# Patient Record
Sex: Female | Born: 1985 | Race: Black or African American | Hispanic: No | Marital: Single | Smoking: Never smoker
Health system: Southern US, Community
[De-identification: ages and names within clinical notes are randomized; demographics above are authoritative.]

## PROBLEM LIST (undated history)

## (undated) DIAGNOSIS — T7840XA Allergy, unspecified, initial encounter: Secondary | ICD-10-CM

## (undated) DIAGNOSIS — E282 Polycystic ovarian syndrome: Secondary | ICD-10-CM

## (undated) DIAGNOSIS — D689 Coagulation defect, unspecified: Secondary | ICD-10-CM

## (undated) DIAGNOSIS — E039 Hypothyroidism, unspecified: Secondary | ICD-10-CM

## (undated) DIAGNOSIS — F4321 Adjustment disorder with depressed mood: Secondary | ICD-10-CM

## (undated) DIAGNOSIS — D649 Anemia, unspecified: Secondary | ICD-10-CM

## (undated) DIAGNOSIS — F419 Anxiety disorder, unspecified: Secondary | ICD-10-CM

## (undated) HISTORY — DX: Polycystic ovarian syndrome: E28.2

## (undated) HISTORY — DX: Anemia, unspecified: D64.9

## (undated) HISTORY — DX: Allergy, unspecified, initial encounter: T78.40XA

## (undated) HISTORY — DX: Coagulation defect, unspecified: D68.9

---

## 2007-04-08 ENCOUNTER — Emergency Department: Payer: Self-pay | Admitting: Emergency Medicine

## 2008-08-25 ENCOUNTER — Emergency Department: Payer: Self-pay | Admitting: Emergency Medicine

## 2008-11-08 ENCOUNTER — Ambulatory Visit: Payer: Self-pay | Admitting: Family Medicine

## 2009-03-05 ENCOUNTER — Inpatient Hospital Stay: Payer: Self-pay | Admitting: Unknown Physician Specialty

## 2009-03-05 ENCOUNTER — Observation Stay: Payer: Self-pay

## 2013-07-19 ENCOUNTER — Emergency Department: Payer: Self-pay | Admitting: Emergency Medicine

## 2013-07-24 ENCOUNTER — Emergency Department: Payer: Self-pay | Admitting: Emergency Medicine

## 2013-07-28 ENCOUNTER — Emergency Department: Payer: Self-pay | Admitting: Emergency Medicine

## 2013-07-30 ENCOUNTER — Emergency Department: Payer: Self-pay | Admitting: Emergency Medicine

## 2013-07-31 ENCOUNTER — Emergency Department: Payer: Self-pay | Admitting: Emergency Medicine

## 2013-07-31 LAB — URINALYSIS, COMPLETE
Bilirubin,UR: NEGATIVE
Nitrite: NEGATIVE
Ph: 6 (ref 4.5–8.0)
RBC,UR: NONE SEEN /HPF (ref 0–5)
Specific Gravity: 1.01 (ref 1.003–1.030)
Squamous Epithelial: 6
WBC UR: 61 /HPF (ref 0–5)

## 2013-07-31 LAB — GC/CHLAMYDIA PROBE AMP

## 2013-07-31 LAB — PREGNANCY, URINE: Pregnancy Test, Urine: NEGATIVE m[IU]/mL

## 2013-08-09 ENCOUNTER — Emergency Department: Payer: Self-pay | Admitting: Internal Medicine

## 2013-08-30 ENCOUNTER — Emergency Department: Payer: Self-pay | Admitting: Emergency Medicine

## 2014-06-28 ENCOUNTER — Emergency Department: Payer: Self-pay | Admitting: Emergency Medicine
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
## 2017-01-08 DIAGNOSIS — E049 Nontoxic goiter, unspecified: Secondary | ICD-10-CM | POA: Diagnosis not present

## 2017-01-08 DIAGNOSIS — E041 Nontoxic single thyroid nodule: Secondary | ICD-10-CM | POA: Diagnosis not present

## 2017-01-26 DIAGNOSIS — L738 Other specified follicular disorders: Secondary | ICD-10-CM | POA: Diagnosis not present

## 2017-03-09 DIAGNOSIS — L738 Other specified follicular disorders: Secondary | ICD-10-CM | POA: Diagnosis not present

## 2017-03-31 DIAGNOSIS — L0291 Cutaneous abscess, unspecified: Secondary | ICD-10-CM | POA: Diagnosis not present

## 2017-04-27 DIAGNOSIS — L732 Hidradenitis suppurativa: Secondary | ICD-10-CM | POA: Diagnosis not present

## 2017-06-02 DIAGNOSIS — L818 Other specified disorders of pigmentation: Secondary | ICD-10-CM | POA: Diagnosis not present

## 2017-06-02 DIAGNOSIS — L731 Pseudofolliculitis barbae: Secondary | ICD-10-CM | POA: Diagnosis not present

## 2017-06-02 DIAGNOSIS — L732 Hidradenitis suppurativa: Secondary | ICD-10-CM | POA: Diagnosis not present

## 2017-07-14 DIAGNOSIS — L731 Pseudofolliculitis barbae: Secondary | ICD-10-CM | POA: Diagnosis not present

## 2017-07-14 DIAGNOSIS — B360 Pityriasis versicolor: Secondary | ICD-10-CM | POA: Diagnosis not present

## 2017-07-14 DIAGNOSIS — L732 Hidradenitis suppurativa: Secondary | ICD-10-CM | POA: Diagnosis not present

## 2017-08-13 DIAGNOSIS — G479 Sleep disorder, unspecified: Secondary | ICD-10-CM | POA: Diagnosis not present

## 2017-08-13 DIAGNOSIS — L509 Urticaria, unspecified: Secondary | ICD-10-CM | POA: Diagnosis not present

## 2017-08-27 DIAGNOSIS — L731 Pseudofolliculitis barbae: Secondary | ICD-10-CM | POA: Diagnosis not present

## 2017-08-27 DIAGNOSIS — L818 Other specified disorders of pigmentation: Secondary | ICD-10-CM | POA: Diagnosis not present

## 2017-08-27 DIAGNOSIS — L732 Hidradenitis suppurativa: Secondary | ICD-10-CM | POA: Diagnosis not present

## 2017-09-17 ENCOUNTER — Ambulatory Visit
Admission: EM | Admit: 2017-09-17 | Discharge: 2017-09-17 | Discharge status: Against Medical Advice | Payer: 59 | Attending: Family Medicine | Admitting: Family Medicine

## 2017-09-17 DIAGNOSIS — N898 Other specified noninflammatory disorders of vagina: Secondary | ICD-10-CM

## 2017-09-17 HISTORY — DX: Adjustment disorder with depressed mood: F43.21

## 2017-09-17 NOTE — ED Triage Notes (Addendum)
Pt reports several days of discharge which she thought was yeast infection and used Monistat Egg. Yesterday she noticed some red color in it and thought she was getting an early menstrual cycle. Today it is "orange with small white clumps." No itching or burning. Denies any odor. Pt also would like a full STD check including blood and swabs.

## 2017-09-17 NOTE — ED Provider Notes (Signed)
Patient left (eloped) before being seen.      Tommie SamsCook, Salley Boxley G, DO 09/17/17 1322

## 2017-09-17 NOTE — ED Notes (Signed)
Patient Eloped. Not seen.

## 2017-10-15 DIAGNOSIS — L731 Pseudofolliculitis barbae: Secondary | ICD-10-CM | POA: Diagnosis not present

## 2017-10-23 DIAGNOSIS — N760 Acute vaginitis: Secondary | ICD-10-CM | POA: Diagnosis not present

## 2017-12-17 DIAGNOSIS — L700 Acne vulgaris: Secondary | ICD-10-CM | POA: Diagnosis not present

## 2017-12-17 DIAGNOSIS — L731 Pseudofolliculitis barbae: Secondary | ICD-10-CM | POA: Diagnosis not present

## 2017-12-17 DIAGNOSIS — L308 Other specified dermatitis: Secondary | ICD-10-CM | POA: Diagnosis not present

## 2017-12-25 DIAGNOSIS — Z3169 Encounter for other general counseling and advice on procreation: Secondary | ICD-10-CM | POA: Diagnosis not present

## 2018-01-26 ENCOUNTER — Encounter: Payer: Self-pay | Admitting: Emergency Medicine

## 2018-01-26 ENCOUNTER — Other Ambulatory Visit: Payer: Self-pay

## 2018-01-26 ENCOUNTER — Ambulatory Visit
Admission: EM | Admit: 2018-01-26 | Discharge: 2018-01-26 | Disposition: A | Discharge status: Home / Self Care | Payer: 59 | Attending: Family Medicine | Admitting: Family Medicine

## 2018-01-26 DIAGNOSIS — R1110 Vomiting, unspecified: Secondary | ICD-10-CM | POA: Diagnosis not present

## 2018-01-26 DIAGNOSIS — M791 Myalgia, unspecified site: Secondary | ICD-10-CM | POA: Diagnosis not present

## 2018-01-26 DIAGNOSIS — R05 Cough: Secondary | ICD-10-CM | POA: Diagnosis not present

## 2018-01-26 DIAGNOSIS — B349 Viral infection, unspecified: Secondary | ICD-10-CM

## 2018-01-26 LAB — RAPID INFLUENZA A&B ANTIGENS (ARMC ONLY): INFLUENZA B (ARMC): NEGATIVE

## 2018-01-26 LAB — RAPID INFLUENZA A&B ANTIGENS: Influenza A (ARMC): NEGATIVE

## 2018-01-26 MED ORDER — ONDANSETRON 4 MG PO TBDP: 4.0000 mg | ORAL_TABLET | Freq: Three times a day (TID) | ORAL | 0 refills | Status: DC | PRN

## 2018-01-26 MED ORDER — BENZONATATE 100 MG PO CAPS: 100.0000 mg | ORAL_CAPSULE | Freq: Three times a day (TID) | ORAL | 0 refills | Status: DC | PRN

## 2018-01-26 NOTE — ED Provider Notes (Addendum)
[facility]    CSN: [id number] Arrival date & time: 01/26/18  [id number]  History   Chief Complaint Chief Complaint  Patient presents with  . Cough  . Emesis  . Generalized Body Aches   HPI  32 year old female presents with the above complaints.  That she has felt poorly since this morning.  She has had cough.  She has been "throwing up mucus".  She reports associated body aches.  She states that she feels very fatigued and weak.  No reports of fever.  Her son is sick with a similar illness.  No medications or interventions tried.  No known exacerbating relieving factors.  No other associated symptoms.  No other complaints at this time.  Past Medical History:  Diagnosis Date  . Situational depression    Past Surgical History:  Procedure Laterality Date  . CESAREAN SECTION     OB History    No data available     Home Medications    Prior to Admission medications   Medication Sig Start Date End Date Taking? Authorizing Provider  escitalopram (LEXAPRO) 5 MG tablet Take 5 mg by mouth daily.   Yes [provider]  benzonatate (TESSALON) 100 MG capsule Take 1 capsule (100 mg total) by mouth 3 (three) times daily as needed. 01/26/18   Tommie Samsook, Tachina Spoonemore G, DO  ondansetron (ZOFRAN-ODT) 4 MG disintegrating tablet Take 1 tablet (4 mg total) by mouth every 8 (eight) hours as needed for nausea or vomiting. 01/26/18   Tommie Samsook, Hiroshi Krummel G, DO    Family History Family History  Problem Relation Age of Onset  . Lupus Mother   . Epilepsy Mother     Social History Social History   Tobacco Use  . Smoking status: Never Smoker  . Smokeless tobacco: Never Used  Substance Use Topics  . Alcohol use: Yes    Comment: social  . Drug use: No     Allergies   Patient has no known allergies.   Review of Systems Review of Systems  Constitutional: Negative for fever.  Respiratory: Positive for cough.   Gastrointestinal: Positive for vomiting.  Musculoskeletal:       Body aches.     Physical Exam Triage Vital Signs ED Triage Vitals  Enc Vitals Group     BP 01/26/18 0846 101/72     Pulse Rate 01/26/18 0846 88     Resp 01/26/18 0846 18     Temp 01/26/18 0846 98.5 F (36.9 C)     Temp Source 01/26/18 0846 Oral     SpO2 01/26/18 0846 100 %     Weight 01/26/18 0843 165 lb (74.8 kg)     Height 01/26/18 0843 5\' 2"  (1.575 m)     Head Circumference --      Peak Flow --      Pain Score 01/26/18 0843 10     Pain Loc --      Pain Edu? --      Excl. in GC? --    Updated Vital Signs BP 101/72 (BP Location: Left Arm)   Pulse 88   Temp 98.5 F (36.9 C) (Oral)   Resp 18   Ht 5\' 2"  (1.575 m)   Wt 165 lb (74.8 kg)   LMP 01/14/2018 (Approximate)   SpO2 100%   BMI 30.18 kg/m  Physical Exam  Constitutional: She is oriented to person, place, and time. She appears well-developed. No distress.  HENT:  Head: Normocephalic and atraumatic.  Mouth/Throat: Oropharynx is clear  and moist.  Eyes: Conjunctivae are normal. Right eye exhibits no discharge. Left eye exhibits no discharge.  Cardiovascular: Normal rate and regular rhythm.  Pulmonary/Chest: Effort normal and breath sounds normal. She has no wheezes. She has no rales.  Neurological: She is alert and oriented to person, place, and time.  Psychiatric: She has a normal mood and affect. Her behavior is normal.  Nursing note and vitals reviewed.  UC Treatments / Results  Labs (all labs ordered are listed, but only abnormal results are displayed) Labs Reviewed  RAPID INFLUENZA A&B ANTIGENS ([facility] ONLY)    EKG  EKG Interpretation None       Radiology No results found.  Procedures Procedures (including critical care time)  Medications Ordered in UC Medications - No data to display   Initial Impression / Assessment and Plan / UC Course  I have reviewed the triage vital signs and the nursing notes.  Pertinent labs & imaging results that were available during my care of the patient were reviewed by me and  considered in my medical decision making (see chart for details).    32 year old female presents with a viral process. Treating with zofran, tessalon perles.  Final Clinical Impressions(s) / UC Diagnoses   Final diagnoses:  Viral illness   ED Discharge Orders        Ordered    ondansetron (ZOFRAN-ODT) 4 MG disintegrating tablet  Every 8 hours PRN     01/26/18 0954    benzonatate (TESSALON) 100 MG capsule  3 times daily PRN     01/26/18 0954     Controlled Substance Prescriptions [place] Controlled Substance Registry consulted? Not Applicable   Tommie Sams, DO 01/26/18 1007    [address] [place], DO 01/26/18 1008

## 2018-01-26 NOTE — ED Triage Notes (Signed)
Patient c/o cough, vomiting and bodyaches that started this morning.

## 2018-01-26 NOTE — Discharge Instructions (Signed)
Rest. Fluids.  Zofran as needed for nausea.  Tessalon for cough.  Take care  Dr. Adriana Simasook

## 2018-01-28 DIAGNOSIS — N970 Female infertility associated with anovulation: Secondary | ICD-10-CM | POA: Diagnosis not present

## 2018-01-30 DIAGNOSIS — N920 Excessive and frequent menstruation with regular cycle: Secondary | ICD-10-CM | POA: Diagnosis not present

## 2018-02-18 DIAGNOSIS — L731 Pseudofolliculitis barbae: Secondary | ICD-10-CM | POA: Diagnosis not present

## 2018-02-18 DIAGNOSIS — L738 Other specified follicular disorders: Secondary | ICD-10-CM | POA: Diagnosis not present

## 2018-04-09 DIAGNOSIS — N920 Excessive and frequent menstruation with regular cycle: Secondary | ICD-10-CM | POA: Diagnosis not present

## 2018-04-15 DIAGNOSIS — L731 Pseudofolliculitis barbae: Secondary | ICD-10-CM | POA: Diagnosis not present

## 2018-05-19 DIAGNOSIS — Z79899 Other long term (current) drug therapy: Secondary | ICD-10-CM | POA: Diagnosis not present

## 2018-05-19 DIAGNOSIS — R7989 Other specified abnormal findings of blood chemistry: Secondary | ICD-10-CM | POA: Diagnosis not present

## 2018-05-19 DIAGNOSIS — R5383 Other fatigue: Secondary | ICD-10-CM | POA: Diagnosis not present

## 2018-05-19 DIAGNOSIS — Z862 Personal history of diseases of the blood and blood-forming organs and certain disorders involving the immune mechanism: Secondary | ICD-10-CM | POA: Diagnosis not present

## 2018-06-21 DIAGNOSIS — R300 Dysuria: Secondary | ICD-10-CM | POA: Diagnosis not present

## 2018-07-16 DIAGNOSIS — E282 Polycystic ovarian syndrome: Secondary | ICD-10-CM | POA: Diagnosis not present

## 2018-07-16 DIAGNOSIS — Z01419 Encounter for gynecological examination (general) (routine) without abnormal findings: Secondary | ICD-10-CM | POA: Diagnosis not present

## 2018-07-16 DIAGNOSIS — N898 Other specified noninflammatory disorders of vagina: Secondary | ICD-10-CM | POA: Diagnosis not present

## 2018-07-30 DIAGNOSIS — L680 Hirsutism: Secondary | ICD-10-CM | POA: Diagnosis not present

## 2018-07-30 DIAGNOSIS — L731 Pseudofolliculitis barbae: Secondary | ICD-10-CM | POA: Diagnosis not present

## 2018-09-10 DIAGNOSIS — L731 Pseudofolliculitis barbae: Secondary | ICD-10-CM | POA: Diagnosis not present

## 2018-09-10 DIAGNOSIS — L732 Hidradenitis suppurativa: Secondary | ICD-10-CM | POA: Diagnosis not present

## 2018-11-15 DIAGNOSIS — J019 Acute sinusitis, unspecified: Secondary | ICD-10-CM | POA: Diagnosis not present

## 2018-12-24 DIAGNOSIS — B351 Tinea unguium: Secondary | ICD-10-CM | POA: Diagnosis not present

## 2019-01-05 DIAGNOSIS — M228X9 Other disorders of patella, unspecified knee: Secondary | ICD-10-CM | POA: Diagnosis not present

## 2019-01-05 DIAGNOSIS — R609 Edema, unspecified: Secondary | ICD-10-CM | POA: Diagnosis not present

## 2019-01-05 DIAGNOSIS — M222X9 Patellofemoral disorders, unspecified knee: Secondary | ICD-10-CM | POA: Diagnosis not present

## 2019-01-05 DIAGNOSIS — R600 Localized edema: Secondary | ICD-10-CM | POA: Diagnosis not present

## 2019-01-05 DIAGNOSIS — M222X1 Patellofemoral disorders, right knee: Secondary | ICD-10-CM | POA: Diagnosis not present

## 2019-01-05 DIAGNOSIS — M79661 Pain in right lower leg: Secondary | ICD-10-CM | POA: Diagnosis not present

## 2019-01-05 DIAGNOSIS — M25561 Pain in right knee: Secondary | ICD-10-CM | POA: Diagnosis not present

## 2019-01-05 DIAGNOSIS — M25461 Effusion, right knee: Secondary | ICD-10-CM | POA: Diagnosis not present

## 2019-01-05 DIAGNOSIS — M79604 Pain in right leg: Secondary | ICD-10-CM | POA: Diagnosis not present

## 2019-01-05 DIAGNOSIS — M7989 Other specified soft tissue disorders: Secondary | ICD-10-CM | POA: Diagnosis not present

## 2019-01-24 DIAGNOSIS — M546 Pain in thoracic spine: Secondary | ICD-10-CM | POA: Diagnosis not present

## 2019-01-24 DIAGNOSIS — M5408 Panniculitis affecting regions of neck and back, sacral and sacrococcygeal region: Secondary | ICD-10-CM | POA: Diagnosis not present

## 2019-01-24 DIAGNOSIS — M9903 Segmental and somatic dysfunction of lumbar region: Secondary | ICD-10-CM | POA: Diagnosis not present

## 2019-04-07 ENCOUNTER — Telehealth: Payer: Self-pay

## 2019-04-07 NOTE — Telephone Encounter (Signed)
Coronavirus (COVID-19) Are you at risk?  Are you at risk for the Coronavirus (COVID-19)?  To be considered HIGH RISK for Coronavirus (COVID-19), you have to meet the following criteria:  . Traveled to [place], [place], [place], [place] or [place]; or in the [place] to [place], [place], [place], or [place]; and have fever, cough, and shortness of breath within the last 2 weeks of travel OR . Been in close contact with a person diagnosed with COVID-19 within the last 2 weeks and have fever, cough, and shortness of breath . IF YOU DO NOT MEET THESE CRITERIA, YOU ARE CONSIDERED LOW RISK FOR COVID-19.  What to do if you are HIGH RISK for COVID-19?  . If you are having a medical emergency, call 911. . Seek medical care right away. Before you go to a doctor's office, urgent care or emergency department, call ahead and tell them about your recent travel, contact with someone diagnosed with COVID-19, and your symptoms. You should receive instructions from your physician's office regarding next steps of care.  . When you arrive at healthcare provider, tell the healthcare staff immediately you have returned from visiting [place], [place], [place], [place] or [place]; or traveled in the [place] to [place], [place], [place], or [place]; in the last two weeks or you have been in close contact with a person diagnosed with COVID-19 in the last 2 weeks.   . Tell the health care staff about your symptoms: fever, cough and shortness of breath. . After you have been seen by a medical provider, you will be either: o Tested for (COVID-19) and discharged home on quarantine except to seek medical care if symptoms worsen, and asked to  - Stay home and avoid contact with others until you get your results (4-5 days)  - Avoid travel on public transportation if possible (such as bus, train, or airplane) or o Sent to the Emergency Department by EMS for evaluation, COVID-19 testing, and possible  admission depending on your condition and test results.  What to do if you are LOW RISK for COVID-19?  Reduce your risk of any infection by using the same precautions used for avoiding the common cold or flu:  . Wash your hands often with soap and warm water for at least 20 seconds.  If soap and water are not readily available, use an alcohol-based hand sanitizer with at least 60% alcohol.  . If coughing or sneezing, cover your mouth and nose by coughing or sneezing into the elbow areas of your shirt or coat, into a tissue or into your sleeve (not your hands). . Avoid shaking hands with others and consider head nods or verbal greetings only. . Avoid touching your eyes, nose, or mouth with unwashed hands.  . Avoid close contact with people who are sick. . Avoid places or events with large numbers of people in one location, like concerts or sporting events. . Carefully consider travel plans you have or are making. . If you are planning any travel outside or inside the [place], visit the CDC's Travelers' Health webpage for the latest health notices. . If you have some symptoms but not all symptoms, continue to monitor at home and seek medical attention if your symptoms worsen. . If you are having a medical emergency, call 911.   ADDITIONAL HEALTHCARE OPTIONS FOR PATIENTS  [facility] / e-Visit: [email or web address]         [facility]: [phone]  [facility]: [phone]                   [facility]: [phone]   Prescreened. Neg .cm 

## 2019-04-08 ENCOUNTER — Other Ambulatory Visit: Payer: Self-pay

## 2019-04-08 ENCOUNTER — Encounter: Payer: Self-pay | Admitting: Certified Nurse Midwife

## 2019-04-08 ENCOUNTER — Ambulatory Visit: Payer: 59 | Admitting: Certified Nurse Midwife

## 2019-04-08 VITALS — BP 101/78 | HR 64 | Ht 62.0 in | Wt 164.1 lb

## 2019-04-08 DIAGNOSIS — R5383 Other fatigue: Secondary | ICD-10-CM

## 2019-04-08 DIAGNOSIS — N939 Abnormal uterine and vaginal bleeding, unspecified: Secondary | ICD-10-CM | POA: Diagnosis not present

## 2019-04-08 DIAGNOSIS — Z8639 Personal history of other endocrine, nutritional and metabolic disease: Secondary | ICD-10-CM

## 2019-04-08 DIAGNOSIS — Z8742 Personal history of other diseases of the female genital tract: Secondary | ICD-10-CM

## 2019-04-08 MED ORDER — DROSPIRENONE-ETHINYL ESTRADIOL 3-0.03 MG PO TABS: 1.0000 | ORAL_TABLET | Freq: Every day | ORAL | 4 refills | Status: DC

## 2019-04-08 NOTE — Progress Notes (Signed)
GYN ENCOUNTER NOTE  Subjective:       Lori Savage is a 33 y.o. 641P1001 female is here for gynecologic evaluation of the following issues:  1. Abnormal uterine bleeding/Menorrhagia  Reports twice monthly, long heavy periods for the last few months accompanied by fatigue and intermittent dizziness.   History significant for PCOS and iron deficiency anemia with pica.   Denies difficulty breathing or respiratory distress, chest pain, abdominal pain, excessive vaginal bleeding, dysuria, and leg pain or swelling.    Gynecologic History  Patient's last menstrual period was 03/30/2019 (exact date).  Contraception: none  Last Pap: 10/2016. Results were: normal  Obstetric History  OB History  Gravida Para Term Preterm AB Living  1 1 1     1   SAB TAB Ectopic Multiple Live Births          1    # Outcome Date GA Lbr Len/2nd Weight Sex Delivery Anes PTL Lv  1 Term 2010   6 lb 15 oz (3.147 kg) M CS-Unspec  N LIV    Past Medical History:  Diagnosis Date  . PCOS (polycystic ovarian syndrome)   . Situational depression     Past Surgical History:  Procedure Laterality Date  . CESAREAN SECTION      Current Outpatient Medications on File Prior to Visit  Medication Sig Dispense Refill  . Biotin 1000 MCG tablet Take by mouth.    . ciclopirox (PENLAC) 8 % solution      No current facility-administered medications on file prior to visit.     No Known Allergies  Social History   Socioeconomic History  . Marital status: Single    Spouse name: Not on file  . Number of children: Not on file  . Years of education: Not on file  . Highest education level: Not on file  Occupational History  . Not on file  Social Needs  . Financial resource strain: Not on file  . Food insecurity:    Worry: Not on file    Inability: Not on file  . Transportation needs:    Medical: Not on file    Non-medical: Not on file  Tobacco Use  . Smoking status: Never Smoker  . Smokeless tobacco:  Never Used  Substance and Sexual Activity  . Alcohol use: Yes    Comment: social  . Drug use: No  . Sexual activity: Not Currently    Birth control/protection: None  Lifestyle  . Physical activity:    Days per week: Not on file    Minutes per session: Not on file  . Stress: Not on file  Relationships  . Social connections:    Talks on phone: Not on file    Gets together: Not on file    Attends religious service: Not on file    Active member of club or organization: Not on file    Attends meetings of clubs or organizations: Not on file    Relationship status: Not on file  . Intimate partner violence:    Fear of current or ex partner: Not on file    Emotionally abused: Not on file    Physically abused: Not on file    Forced sexual activity: Not on file  Other Topics Concern  . Not on file  Social History Narrative  . Not on file    Family History  Problem Relation Age of Onset  . Lupus Mother   . Epilepsy Mother   . Breast cancer Maternal Grandmother   .  Diabetes Maternal Grandmother     The following portions of the patient's history were reviewed and updated as appropriate: allergies, current medications, past family history, past medical history, past social history, past surgical history and problem list.  Review of Systems  ROS negative except as noted above. Information obtained from aptient.   Objective:   BP 101/78   Pulse 64   Ht 5\' 2"  (1.575 m)   Wt 164 lb 2 oz (74.4 kg)   LMP 03/30/2019 (Exact Date)   BMI 30.02 kg/m    CONSTITUTIONAL: Well-developed, well-nourished female in no acute distress.   PHYSICAL EXAM: Not indicated.   Labs: pending  Assessment:   1. History of PCOS  - Estradiol - FSH/LH - Thyroid Panel With TSH - Testosterone, Free, Total, SHBG - Hemoglobin A1c - CBC - Ferritin  2. Abnormal uterine bleeding  - Estradiol - FSH/LH - Thyroid Panel With TSH - CBC - Ferritin  3. Fatigue, unspecified type  - Thyroid Panel  With TSH  4. History of iron deficiency   Plan:   Education regarding PCOS and management options; see AVS.   Labs: see orders, will contact patient via MyChart with results.   Rx: Yaz, see orders.   Reviewed red flag symptoms and when to call.   RTC x 3 months for ANNUAL EXAM with PAP or sooner if needed.    Gunnar Bulla, CNM [facility]   A total of 20 minutes were spent face-to-face with the patient during the encounter with greater than 50% dealing with counseling and coordination of care.

## 2019-04-08 NOTE — Patient Instructions (Addendum)
Polycystic Ovarian Syndrome  Polycystic ovarian syndrome (PCOS) is a common hormonal disorder among women of reproductive age. In most women with PCOS, many small fluid-filled sacs (cysts) grow on the ovaries, and the cysts are not part of a normal menstrual cycle. PCOS can cause problems with your menstrual periods and make it difficult to get pregnant. It can also cause an increased risk of miscarriage with pregnancy. If it is not treated, PCOS can lead to serious health problems, such as diabetes and heart disease. What are the causes? The cause of PCOS is not known, but it may be the result of a combination of certain factors, such as:  Irregular menstrual cycle.  High levels of certain hormones (androgens).  Problems with the hormone that helps to control blood sugar (insulin resistance).  Certain genes. What increases the risk? This condition is more likely to develop in women who have a family history of PCOS. What are the signs or symptoms? Symptoms of PCOS may include:  Multiple ovarian cysts.  Infrequent periods or no periods.  Periods that are too frequent or too heavy.  Unpredictable periods.  Inability to get pregnant (infertility) because of not ovulating.  Increased growth of hair on the face, chest, stomach, back, thumbs, thighs, or toes.  Acne or oily skin. Acne may develop during adulthood, and it may not respond to treatment.  Pelvic pain.  Weight gain or obesity.  Patches of thickened and dark brown or black skin on the neck, arms, breasts, or thighs (acanthosis nigricans).  Excess hair growth on the face, chest, abdomen, or upper thighs (hirsutism). How is this diagnosed? This condition is diagnosed based on:  Your medical history.  A physical exam, including a pelvic exam. Your health care provider may look for areas of increased hair growth on your skin.  Tests, such as: ? Ultrasound. This may be used to examine the ovaries and the lining of the  uterus (endometrium) for cysts. ? Blood tests. These may be used to check levels of sugar (glucose), female hormone (testosterone), and female hormones (estrogen and progesterone) in your blood. How is this treated? There is no cure for PCOS, but treatment can help to manage symptoms and prevent more health problems from developing. Treatment varies depending on:  Your symptoms.  Whether you want to have a baby or whether you need birth control (contraception). Treatment may include nutrition and lifestyle changes along with:  Progesterone hormone to start a menstrual period.  Birth control pills to help you have regular menstrual periods.  Medicines to make you ovulate, if you want to get pregnant.  Medicine to reduce excessive hair growth.  Surgery, in severe cases. This may involve making small holes in one or both of your ovaries. This decreases the amount of testosterone that your body produces. Follow these instructions at home:  Take over-the-counter and prescription medicines only as told by your health care provider.  Follow a healthy meal plan. This can help you reduce the effects of PCOS. ? Eat a healthy diet that includes lean proteins, complex carbohydrates, fresh fruits and vegetables, low-fat dairy products, and healthy fats. Make sure to eat enough fiber.  If you are overweight, lose weight as told by your health care provider. ? Losing 10% of your body weight may improve symptoms. ? Your health care provider can determine how much weight loss is best for you and can help you lose weight safely.  Keep all follow-up visits as told by your health care provider.  This is important. Contact a health care provider if:  Your symptoms do not get better with medicine.  You develop new symptoms. This information is not intended to replace advice given to you by your health care provider. Make sure you discuss any questions you have with your health care provider. Document  Released: 02/27/2005 Document Revised: 07/01/2016 Document Reviewed: 04/20/2016 Elsevier Interactive Patient Education  2019 [employer] Diet for Polycystic Ovary Syndrome Polycystic ovary syndrome (PCOS) is a disorder of the chemicals (hormones) that regulate a woman's reproductive system, including monthly periods (menstruation). The condition causes important hormones to be out of balance. PCOS can:  Stop your periods or make them irregular.  Cause cysts to develop on your ovaries.  Make it difficult to get pregnant.  Stop your body from responding to the effects of insulin (insulin resistance). Insulin resistance can lead to obesity and diabetes. Changing what you eat can help you manage PCOS and improve your health. Following a balanced diet can help you lose weight and improve the way that your body uses insulin. What are tips for following this plan?  Follow a balanced diet for meals and snacks. Eat breakfast, lunch, dinner, and one or two snacks every day.  Include protein in each meal and snack.  Choose whole grains instead of products that are made with refined flour.  Eat a variety of foods.  Exercise regularly as told by your health care provider. Aim to do 30 or more minutes of exercise on most days of the week.  If you are overweight or obese: ? Pay attention to how many calories you eat. Cutting down on calories can help you lose weight. ? Work with your health care provider or a diet and nutrition specialist (dietitian) to figure out how many calories you need each day. What foods can I eat?  Fruits Include a variety of colors and types. All fruits are helpful for PCOS. Vegetables Include a variety of colors and types. All vegetables are helpful for PCOS. Grains Whole grains, such as whole wheat. Whole-grain breads, crackers, cereals, and pasta. Unsweetened oatmeal, bulgur, barley, quinoa, and brown rice. Tortillas made from corn or whole-wheat flour. Meats and  other proteins Low-fat (lean) proteins, such as fish, chicken, beans, eggs, and tofu. Dairy Low-fat dairy products, such as skim milk, cheese sticks, and yogurt. Beverages Low-fat or fat-free drinks, such as water, low-fat milk, sugar-free drinks, and small amounts of 100% fruit juice. Seasonings and condiments Ketchup. Mustard. Barbecue sauce. Relish. Low-fat or fat-free mayonnaise. Fats and oils Olive oil or canola oil. Walnuts and almonds. The items listed above may not be a complete list of recommended foods and beverages. Contact a dietitian for more options. What foods are not recommended? Foods that are high in calories or fat. Fried foods. Sweets. Products that are made from refined white flour, including white bread, pastries, white rice, and pasta. The items listed above may not be a complete list of foods and beverages to avoid. Contact a dietitian for more information. Summary  PCOS is a hormonal imbalance that affects a woman's reproductive system.  You can help to manage your PCOS by exercising regularly and eating a healthy, varied diet of vegetables, fruit, whole grains, low-fat (lean) protein, and low-fat dairy products.  Changing what you eat can improve the way that your body uses insulin, help your hormones reach normal levels, and help you lose weight. This information is not intended to replace advice given to you by your health care  provider. Make sure you discuss any questions you have with your health care provider. Document Released: 02/25/2016 Document Revised: 09/07/2017 Document Reviewed: 09/07/2017 Elsevier Interactive Patient Education  2019 [employer] Drospirenone; Ethinyl Estradiol tablets What is this medicine? DROSPIRENONE; ETHINYL ESTRADIOL (dro SPY re nown; ETH in il es tra DYE ole) is an oral contraceptive (birth control pill). This medicine combines two types of female hormones, an estrogen and a progestin. It is used to prevent ovulation and  pregnancy. This medicine may be used for other purposes; ask your health care provider or pharmacist if you have questions. COMMON BRAND NAME(S): Ovidio Hanger, Lo-Zumandimine, Elmer Ramp 28-Day, Ocella, Syeda, Vestura, Alphonse Guild, Zumandimine What should I tell my health care provider before I take this medicine? They need to know if you have or ever had any of these conditions: -abnormal vaginal bleeding -adrenal gland disease -blood vessel disease or blood clots -breast, cervical, endometrial, ovarian, liver, or uterine cancer -diabetes -gallbladder disease -heart disease or recent heart attack -high blood pressure -high cholesterol -high potassium level -kidney disease -liver disease -migraine headaches -stroke -systemic lupus erythematosus (SLE) -tobacco smoker -an unusual or allergic reaction to estrogens, progestins, or other medicines, foods, dyes, or preservatives -pregnant or trying to get pregnant -breast-feeding How should I use this medicine? Take this medicine by mouth. To reduce nausea, this medicine may be taken with food. Follow the directions on the prescription label. Take this medicine at the same time each day and in the order directed on the package. Do not take your medicine more often than directed. A patient package insert for the product will be given with each prescription and refill. Read this sheet carefully each time. The sheet may change frequently. Talk to your pediatrician regarding the use of this medicine in children. Special care may be needed. This medicine has been used in female children who have started having menstrual periods. Overdosage: If you think you have taken too much of this medicine contact a poison control center or emergency room at once. NOTE: This medicine is only for you. Do not share this medicine with others. What if I miss a dose? If you miss a dose, refer to the patient information sheet you received with your  medicine for direction. If you miss more than one pill, this medicine may not be as effective and you may need to use another form of birth control. What may interact with this medicine? Do not take this medicine with any of the following medications: -aminoglutethimide -amprenavir, fosamprenavir -atazanavir; cobicistat -anastrozole -bosentan -exemestane -letrozole -metyrapone -testolactone This medicine may also interact with the following medications: -acetaminophen -antiviral medicines for HIV or AIDS -aprepitant -barbiturates -certain antibiotics like rifampin, rifabutin, rifapentine, and possibly penicillins or tetracyclines -certain diuretics like amiloride, spironolactone, triamterene -certain medicines for fungal infections like griseofulvin, ketoconazole, itraconazole -certain medications for high blood pressure or heart conditions like ACE-inhibitors, Angiotensin-II receptor blockers, eplerenone -certain medicines for seizures like carbamazepine, oxcarbazepine, phenobarbital, phenytoin -cholestyramine -cobicistat -corticosteroid like hydrocortisone and prednisolone -cyclosporine -dantrolene -felbamate -grapefruit juice -heparin -lamotrigine -medicines for diabetes, including pioglitazone -modafinil -NSAIDs -potassium supplements -pyrimethamine -raloxifene -St. John's wort -sulfasalazine -tamoxifen -topiramate -thyroid hormones -warfarin his list may not describe all possible interactions. Give your health care provider a list of all the medicines, herbs, non-prescription drugs, or dietary supplements you use. Also tell them if you smoke, drink alcohol, or use illegal drugs. Some items may interact with your medicine. This list may not describe all possible interactions. Give your health  care provider a list of all the medicines, herbs, non-prescription drugs, or dietary supplements you use. Also tell them if you smoke, drink alcohol, or use illegal drugs. Some  items may interact with your medicine. What should I watch for while using this medicine? Visit your doctor or health care professional for regular checks on your progress. You will need a regular breast and pelvic exam and Pap smear while on this medicine. Use an additional method of contraception during the first cycle that you take these tablets. If you have any reason to think you are pregnant, stop taking this medicine right away and contact your doctor or health care professional. If you are taking this medicine for hormone related problems, it may take several cycles of use to see improvement in your condition. Smoking increases the risk of getting a blood clot or having a stroke while you are taking birth control pills, especially if you are more than 31 years old. You are strongly advised not to smoke. This medicine can make your body retain fluid, making your fingers, hands, or ankles swell. Your blood pressure can go up. Contact your doctor or health care professional if you feel you are retaining fluid. This medicine can make you more sensitive to the sun. Keep out of the sun. If you cannot avoid being in the sun, wear protective clothing and use sunscreen. Do not use sun lamps or tanning beds/booths. If you wear contact lenses and notice visual changes, or if the lenses begin to feel uncomfortable, consult your eye care specialist. In some women, tenderness, swelling, or minor bleeding of the gums may occur. Notify your dentist if this happens. Brushing and flossing your teeth regularly may help limit this. See your dentist regularly and inform your dentist of the medicines you are taking. If you are going to have elective surgery, you may need to stop taking this medicine before the surgery. Consult your health care professional for advice. This medicine does not protect you against HIV infection (AIDS) or any other sexually transmitted diseases. What side effects may I notice from  receiving this medicine? Side effects that you should report to your doctor or health care professional as soon as possible: -allergic reactions like skin rash, itching or hives, swelling of the face, lips, or tongue -breast tissue changes or discharge -changes in vision -chest pain -confusion, trouble speaking or understanding -dark urine -general ill feeling or flu-like symptoms -light-colored stools -nausea, vomiting -pain, swelling, warmth in the leg -right upper belly pain -severe headaches -shortness of breath -sudden numbness or weakness of the face, arm or leg -trouble walking, dizziness, loss of balance or coordination -unusual vaginal bleeding -yellowing of the eyes or skin Side effects that usually do not require medical attention (report to your doctor or health care professional if they continue or are bothersome): -acne -brown spots on the face -change in appetite -change in sexual desire -depressed mood or mood swings -fluid retention and swelling -stomach cramps or bloating -unusually weak or tired -weight gain This list may not describe all possible side effects. Call your doctor for medical advice about side effects. You may report side effects to FDA at 1-800-FDA-1088. Where should I keep my medicine? Keep out of the reach of children. Store at room temperature between 15 and 30 degrees C (59 and 86 degrees F). Throw away any unused medicine after the expiration date. NOTE: This sheet is a summary. It may not cover all possible information. If you have questions about  this medicine, talk to your doctor, pharmacist, or health care provider.  2019 Elsevier/Gold Standard (2016-07-25 13:52:56)

## 2019-04-11 LAB — CBC
Hematocrit: 37.5 % (ref 34.0–46.6)
Hemoglobin: 11.9 g/dL (ref 11.1–15.9)
MCH: 24.5 pg — ABNORMAL LOW (ref 26.6–33.0)
MCHC: 31.7 g/dL (ref 31.5–35.7)
MCV: 77 fL — ABNORMAL LOW (ref 79–97)
Platelets: 324 10*3/uL (ref 150–450)
RBC: 4.85 x10E6/uL (ref 3.77–5.28)
RDW: 13.7 % (ref 11.7–15.4)
WBC: 4.8 10*3/uL (ref 3.4–10.8)

## 2019-04-11 LAB — THYROID PANEL WITH TSH
Free Thyroxine Index: 1.7 (ref 1.2–4.9)
T3 Uptake Ratio: 23 % — ABNORMAL LOW (ref 24–39)
T4, Total: 7.5 ug/dL (ref 4.5–12.0)
TSH: 0.91 u[IU]/mL (ref 0.450–4.500)

## 2019-04-11 LAB — ESTRADIOL: Estradiol: 33.6 pg/mL

## 2019-04-11 LAB — FSH/LH
FSH: 5.4 m[IU]/mL
LH: 13.9 m[IU]/mL

## 2019-04-11 LAB — TESTOSTERONE, FREE, TOTAL, SHBG
Sex Hormone Binding: 36.3 nmol/L (ref 24.6–122.0)
Testosterone, Free: 2.1 pg/mL (ref 0.0–4.2)
Testosterone: 42 ng/dL (ref 8–48)

## 2019-04-11 LAB — HEMOGLOBIN A1C
Est. average glucose Bld gHb Est-mCnc: 97 mg/dL
Hgb A1c MFr Bld: 5 % (ref 4.8–5.6)

## 2019-04-11 LAB — FERRITIN: Ferritin: 12 ng/mL — ABNORMAL LOW (ref 15–150)

## 2019-05-05 ENCOUNTER — Encounter: Payer: 59 | Admitting: Obstetrics & Gynecology

## 2019-07-13 ENCOUNTER — Telehealth: Payer: Self-pay

## 2019-07-13 NOTE — Telephone Encounter (Signed)
Coronavirus (COVID-19) Are you at risk?  Are you at risk for the Coronavirus (COVID-19)?  To be considered HIGH RISK for Coronavirus (COVID-19), you have to meet the following criteria:  . Traveled to [place], [place], [place], [place] or [place]; or in the [place] to [place], [place], [place], or [place]; and have fever, cough, and shortness of breath within the last 2 weeks of travel OR . Been in close contact with a person diagnosed with COVID-19 within the last 2 weeks and have fever, cough, and shortness of breath . IF YOU DO NOT MEET THESE CRITERIA, YOU ARE CONSIDERED LOW RISK FOR COVID-19.  What to do if you are HIGH RISK for COVID-19?  . If you are having a medical emergency, call 911. . Seek medical care right away. Before you go to a doctor's office, urgent care or emergency department, call ahead and tell them about your recent travel, contact with someone diagnosed with COVID-19, and your symptoms. You should receive instructions from your physician's office regarding next steps of care.  . When you arrive at healthcare provider, tell the healthcare staff immediately you have returned from visiting [place], [place], [place], [place] or [place]; or traveled in the [place] to [place], [place], [place], or [place]; in the last two weeks or you have been in close contact with a person diagnosed with COVID-19 in the last 2 weeks.   . Tell the health care staff about your symptoms: fever, cough and shortness of breath. . After you have been seen by a medical provider, you will be either: o Tested for (COVID-19) and discharged home on quarantine except to seek medical care if symptoms worsen, and asked to  - Stay home and avoid contact with others until you get your results (4-5 days)  - Avoid travel on public transportation if possible (such as bus, train, or airplane) or o Sent to the Emergency Department by EMS for evaluation, COVID-19 testing, and possible  admission depending on your condition and test results.  What to do if you are LOW RISK for COVID-19?  Reduce your risk of any infection by using the same precautions used for avoiding the common cold or flu:  . Wash your hands often with soap and warm water for at least 20 seconds.  If soap and water are not readily available, use an alcohol-based hand sanitizer with at least 60% alcohol.  . If coughing or sneezing, cover your mouth and nose by coughing or sneezing into the elbow areas of your shirt or coat, into a tissue or into your sleeve (not your hands). . Avoid shaking hands with others and consider head nods or verbal greetings only. . Avoid touching your eyes, nose, or mouth with unwashed hands.  . Avoid close contact with people who are Yaasir Menken. . Avoid places or events with large numbers of people in one location, like concerts or sporting events. . Carefully consider travel plans you have or are making. . If you are planning any travel outside or inside the [place], visit the CDC's Travelers' Health webpage for the latest health notices. . If you have some symptoms but not all symptoms, continue to monitor at home and seek medical attention if your symptoms worsen. . If you are having a medical emergency, call 911.  07/13/19 SCREENING NEG [facility] ADDITIONAL HEALTHCARE OPTIONS FOR PATIENTS  [facility] / e-Visit: [email or web address]         [facility]: [phone]    [facility]: [phone]                   [facility]: [phone]  

## 2019-07-14 ENCOUNTER — Encounter: Payer: 59 | Admitting: Certified Nurse Midwife

## 2019-07-14 ENCOUNTER — Ambulatory Visit (INDEPENDENT_AMBULATORY_CARE_PROVIDER_SITE_OTHER): Payer: 59 | Admitting: Certified Nurse Midwife

## 2019-07-14 ENCOUNTER — Other Ambulatory Visit: Payer: Self-pay

## 2019-07-14 ENCOUNTER — Encounter: Payer: Self-pay | Admitting: Certified Nurse Midwife

## 2019-07-14 ENCOUNTER — Other Ambulatory Visit (HOSPITAL_COMMUNITY)
Admission: RE | Admit: 2019-07-14 | Discharge: 2019-07-14 | Disposition: A | Discharge status: Home / Self Care | Payer: 59 | Source: Ambulatory Visit | Attending: Certified Nurse Midwife | Admitting: Certified Nurse Midwife

## 2019-07-14 VITALS — BP 94/64 | HR 85 | Ht 62.0 in | Wt 159.9 lb

## 2019-07-14 DIAGNOSIS — Z01419 Encounter for gynecological examination (general) (routine) without abnormal findings: Secondary | ICD-10-CM | POA: Insufficient documentation

## 2019-07-14 DIAGNOSIS — Z3169 Encounter for other general counseling and advice on procreation: Secondary | ICD-10-CM | POA: Diagnosis not present

## 2019-07-14 DIAGNOSIS — R300 Dysuria: Secondary | ICD-10-CM

## 2019-07-14 DIAGNOSIS — Z124 Encounter for screening for malignant neoplasm of cervix: Secondary | ICD-10-CM

## 2019-07-14 LAB — POCT URINALYSIS DIPSTICK
Bilirubin, UA: NEGATIVE
Blood, UA: NEGATIVE
Glucose, UA: NEGATIVE
Ketones, UA: NEGATIVE
Nitrite, UA: NEGATIVE
Protein, UA: NEGATIVE
Spec Grav, UA: 1.025 (ref 1.010–1.025)
Urobilinogen, UA: 0.2 E.U./dL
pH, UA: 6 (ref 5.0–8.0)

## 2019-07-14 NOTE — Progress Notes (Signed)
ANNUAL PREVENTATIVE CARE GYN  ENCOUNTER NOTE  Subjective:       Lori Savage is a 33 y.o. G15P1001 female here for a routine annual gynecologic exam.  Current complaints: 1. Needs Pap smear 2. Desires pregnancy  History significant for PCOS. Denies difficulty breathing or respiratory distress, chest pain, abdominal pain, excessive vaginal bleeding, dysuria, and leg pain or swelling.    Gynecologic History  Patient's last menstrual period was 06/26/2019 (exact date). Period Cycle (Days): 30 Period Duration (Days): 10 Period Pattern: Regular Menstrual Flow: Moderate, Heavy Menstrual Control: Maxi pad Dysmenorrhea: (!) Severe Dysmenorrhea Symptoms: Cramping, Other (Comment), Headache(back pain)  Contraception: none  Last Pap: 10/2016. Results were: normal  Obstetric History  OB History  Gravida Para Term Preterm AB Living  1 1 1     1   SAB TAB Ectopic Multiple Live Births          1    # Outcome Date GA Lbr Len/2nd Weight Sex Delivery Anes PTL Lv  1 Term 03/06/09   6 lb 15 oz (3.147 kg) M CS-Unspec EPI N LIV     Complications: Failure to Progress in Second Stage, Fetal Intolerance    Past Medical History:  Diagnosis Date  . PCOS (polycystic ovarian syndrome)   . Situational depression     Past Surgical History:  Procedure Laterality Date  . CESAREAN SECTION      Current Outpatient Medications on File Prior to Visit  Medication Sig Dispense Refill  . Biotin 1000 MCG tablet Take by mouth.     No current facility-administered medications on file prior to visit.     No Known Allergies  Social History   Socioeconomic History  . Marital status: Single    Spouse name: Not on file  . Number of children: Not on file  . Years of education: Not on file  . Highest education level: Not on file  Occupational History  . Not on file  Social Needs  . Financial resource strain: Not on file  . Food insecurity    Worry: Not on file    Inability: Not on file  .  Transportation needs    Medical: Not on file    Non-medical: Not on file  Tobacco Use  . Smoking status: Never Smoker  . Smokeless tobacco: Never Used  Substance and Sexual Activity  . Alcohol use: Yes    Comment: social  . Drug use: No  . Sexual activity: Yes    Birth control/protection: None  Lifestyle  . Physical activity    Days per week: Not on file    Minutes per session: Not on file  . Stress: Not on file  Relationships  . Social Herbalist on phone: Not on file    Gets together: Not on file    Attends religious service: Not on file    Active member of club or organization: Not on file    Attends meetings of clubs or organizations: Not on file    Relationship status: Not on file  . Intimate partner violence    Fear of current or ex partner: Not on file    Emotionally abused: Not on file    Physically abused: Not on file    Forced sexual activity: Not on file  Other Topics Concern  . Not on file  Social History Narrative  . Not on file    Family History  Problem Relation Age of Onset  . Lupus Mother   .  Epilepsy Mother   . Breast cancer Maternal Grandmother   . Diabetes Maternal Grandmother   . Ovarian cancer Neg Hx   . Colon cancer Neg Hx     The following portions of the patient's history were reviewed and updated as appropriate: allergies, current medications, past family history, past medical history, past social history, past surgical history and problem list.  Review of Systems  ROS negative except as noted above. Information obtained from patient.    Objective:   BP 94/64   Pulse 85   Ht 5\' 2"  (1.575 m)   Wt 159 lb 14.4 oz (72.5 kg)   LMP 06/26/2019 (Exact Date)   BMI 29.25 kg/m    CONSTITUTIONAL: Well-developed, well-nourished female in no acute distress.   PSYCHIATRIC: Normal mood and affect. Normal behavior. Normal judgment and thought content.  NEUROLGIC: Alert and oriented to person, place, and time. Normal muscle tone  coordination. No cranial nerve deficit noted.  HENT:  Normocephalic, atraumatic, External right and left ear normal.  EYES: Conjunctivae and EOM are normal. Pupils are equal and round.   NECK: Normal range of motion, supple, no masses.  Normal thyroid.   SKIN: Skin is warm and dry. No rash noted. Not diaphoretic. No erythema. No pallor. Professional tattoos present.   CARDIOVASCULAR: Normal heart rate noted, regular rhythm, no murmur.  RESPIRATORY: Clear to auscultation bilaterally. Effort and breath sounds normal, no problems with respiration noted.  BREASTS: Symmetric in size. No masses, skin changes, nipple drainage, or lymphadenopathy.  ABDOMEN: Soft, normal bowel sounds, no distention noted.  No tenderness, rebound or guarding.   PELVIC:  External Genitalia: Normal  Vagina: Normal  Cervix: Normal, Pap collected  Uterus: Normal  Adnexa: Normal   MUSCULOSKELETAL: Normal range of motion. No tenderness.  No cyanosis, clubbing, or edema.  2+ distal pulses.  LYMPHATIC: No Axillary, Supraclavicular, or Inguinal Adenopathy.  Assessment:   Annual gynecologic examination 33 y.o.   Contraception: none   Overweight   Problem List Items Addressed This Visit    None    Visit Diagnoses    Well woman exam    -  Primary   Relevant Orders   Cytology - PAP   Urine Culture   Screening for cervical cancer       Relevant Orders   Cytology - PAP   Encounter for preconception consultation       Dysuria       Relevant Orders   Urine Culture      Plan:   Pap: Pap Co Test  Labs: Declined   Routine preventative health maintenance measures emphasized: Preconception counseling, Exercise/Diet/Weight control, Tobacco Warnings, Alcohol/Substance use risks, Stress Management, Peer Pressure Issues and Safe Sex, see AVS  Discussed cycle tracking, use of ovulation test strips and ovulation induction prn; verbalized understanding  Reviewed red flag symptoms and when to call  RTC x 3  months for follow up or sooner if needed   Gunnar BullaJenkins Michelle Ceniya Fowers, CNM [facility] 07/14/19 2:38 PM

## 2019-07-14 NOTE — Progress Notes (Signed)
Patient here for annual exam, stopped taking OCP 2 weeks ago.  Will like to discuss different contraceptive options.

## 2019-07-14 NOTE — Patient Instructions (Signed)
 Vaginitis Vaginitis is a condition in which the vaginal tissue swells and becomes red (inflamed). This condition is most often caused by a change in the normal balance of bacteria and yeast that live in the vagina. This change causes an overgrowth of certain bacteria or yeast, which causes the inflammation. There are different types of vaginitis, but the most common types are:  Bacterial vaginosis.  Yeast infection (candidiasis).  Trichomoniasis vaginitis. This is a sexually transmitted disease (STD).  Viral vaginitis.  Atrophic vaginitis.  Allergic vaginitis. What are the causes? The cause of this condition depends on the type of vaginitis. It can be caused by:  Bacteria (bacterial vaginosis).  Yeast, which is a fungus (yeast infection).  A parasite (trichomoniasis vaginitis).  A virus (viral vaginitis).  Low hormone levels (atrophic vaginitis). Low hormone levels can occur during pregnancy, breastfeeding, or after menopause.  Irritants, such as bubble baths, scented tampons, and feminine sprays (allergic vaginitis). Other factors can change the normal balance of the yeast and bacteria that live in the vagina. These include:  Antibiotic medicines.  Poor hygiene.  Diaphragms, vaginal sponges, spermicides, birth control pills, and intrauterine devices (IUD).  Sex.  Infection.  Uncontrolled diabetes.  A weakened defense (immune) system. What increases the risk? This condition is more likely to develop in women who:  Smoke.  Use vaginal douches, scented tampons, or scented sanitary pads.  Wear tight-fitting pants.  Wear thong underwear.  Use oral birth control pills or an IUD.  Have sex without a condom.  Have multiple sex partners.  Have an STD.  Frequently use the spermicide nonoxynol-9.  Eat lots of foods high in sugar.  Have uncontrolled diabetes.  Have low estrogen levels.  Have a weakened immune system from an immune disorder or medical  treatment.  Are pregnant or breastfeeding. What are the signs or symptoms? Symptoms vary depending on the cause of the vaginitis. Common symptoms include:  Abnormal vaginal discharge. ? The discharge is white, gray, or yellow with bacterial vaginosis. ? The discharge is thick, white, and cheesy with a yeast infection. ? The discharge is frothy and yellow or greenish with trichomoniasis.  A bad vaginal smell. The smell is fishy with bacterial vaginosis.  Vaginal itching, pain, or swelling.  Sex that is painful.  Pain or burning when urinating. Sometimes there are no symptoms. How is this diagnosed? This condition is diagnosed based on your symptoms and medical history. A physical exam, including a pelvic exam, will also be done. You may also have other tests, including:  Tests to determine the pH level (acidity or alkalinity) of your vagina.  A whiff test, to assess the odor that results when a sample of your vaginal discharge is mixed with a potassium hydroxide solution.  Tests of vaginal fluid. A sample will be examined under a microscope. How is this treated? Treatment varies depending on the type of vaginitis you have. Your treatment may include:  Antibiotic creams or pills to treat bacterial vaginosis and trichomoniasis.  Antifungal medicines, such as vaginal creams or suppositories, to treat a yeast infection.  Medicine to ease discomfort if you have viral vaginitis. Your sexual partner should also be treated.  Estrogen delivered in a cream, pill, suppository, or vaginal ring to treat atrophic vaginitis. If vaginal dryness occurs, lubricants and moisturizing creams may help. You may need to avoid scented soaps, sprays, or douches.  Stopping use of a product that is causing allergic vaginitis. Then using a vaginal cream to treat the symptoms.   these instructions at home: Lifestyle  Keep your genital area clean and dry. Avoid soap, and only rinse the area with  water.  Do not douche or use tampons until your health care provider says it is okay to do so. Use sanitary pads, if needed.  Do not have sex until your health care provider approves. When you can return to sex, practice safe sex and use condoms.  Wipe from front to back. This avoids the spread of bacteria from the rectum to the vagina. General instructions  Take over-the-counter and prescription medicines only as told by your health care provider.  If you were prescribed an antibiotic medicine, take or use it as told by your health care provider. Do not stop taking or using the antibiotic even if you start to feel better.  Keep all follow-up visits as told by your health care provider. This is important. How is this prevented?  Use mild, non-scented products. Do not use things that can irritate the vagina, such as fabric softeners. Avoid the following products if they are scented: ? Feminine sprays. ? Detergents. ? Tampons. ? Feminine hygiene products. ? Soaps or bubble baths.  Let air reach your genital area. ? Wear cotton underwear to reduce moisture buildup. ? Avoid wearing underwear while you sleep. ? Avoid wearing tight pants and underwear or nylons without a cotton panel. ? Avoid wearing thong underwear.  Take off any wet clothing, such as bathing suits, as soon as possible.  Practice safe sex and use condoms. Contact a health care provider if:  You have abdominal pain.  You have a fever.  You have symptoms that last for more than 2-3 days. Get help right away if:  You have a fever and your symptoms suddenly get worse. Summary  Vaginitis is a condition in which the vaginal tissue becomes inflamed.This condition is most often caused by a change in the normal balance of bacteria and yeast that live in the vagina.  Treatment varies depending on the type of vaginitis you have.  Do not douche, use tampons , or have sex until your health care provider approves. When  you can return to sex, practice safe sex and use condoms. This information is not intended to replace advice given to you by your health care provider. Make sure you discuss any questions you have with your health care provider. Document Released: 08/31/2007 Document Revised: 10/16/2017 Document Reviewed: 12/09/2016 Elsevier Patient Education  2020 Lori Savage 52-19 Years Old, Female Preventive care refers to visits with your health care provider and lifestyle choices that can promote health and wellness. This includes:  A yearly physical exam. This may also be called an annual well check.  Regular dental visits and eye exams.  Immunizations.  Screening for certain conditions.  Healthy lifestyle choices, such as eating a healthy diet, getting regular exercise, not using drugs or products that contain nicotine and tobacco, and limiting alcohol use. What can I expect for my preventive care visit? Physical exam Your health care provider will check your:  Height and weight. This may be used to calculate body mass index (BMI), which tells if you are at a healthy weight.  Heart rate and blood pressure.  Skin for abnormal spots. Counseling Your health care provider may ask you questions about your:  Alcohol, tobacco, and drug use.  Emotional well-being.  Home and relationship well-being.  Sexual activity.  Eating habits.  Work and work Statistician.  Method of birth control.  Menstrual cycle.  Pregnancy history. What immunizations do I need?  Influenza (flu) vaccine  This is recommended every year. Tetanus, diphtheria, and pertussis (Tdap) vaccine  You may need a Td booster every 10 years. Varicella (chickenpox) vaccine  You may need this if you have not been vaccinated. Human papillomavirus (HPV) vaccine  If recommended by your health care provider, you may need three doses over 6 months. Measles, mumps, and rubella (MMR) vaccine  You may need  at least one dose of MMR. You may also need a second dose. Meningococcal conjugate (MenACWY) vaccine  One dose is recommended if you are age 22-21 years and a first-year college student living in a residence hall, or if you have one of several medical conditions. You may also need additional booster doses. Pneumococcal conjugate (PCV13) vaccine  You may need this if you have certain conditions and were not previously vaccinated. Pneumococcal polysaccharide (PPSV23) vaccine  You may need one or two doses if you smoke cigarettes or if you have certain conditions. Hepatitis A vaccine  You may need this if you have certain conditions or if you travel or work in places where you may be exposed to hepatitis A. Hepatitis B vaccine  You may need this if you have certain conditions or if you travel or work in places where you may be exposed to hepatitis B. Haemophilus influenzae type b (Hib) vaccine  You may need this if you have certain conditions. You may receive vaccines as individual doses or as more than one vaccine together in one shot (combination vaccines). Talk with your health care provider about the risks and benefits of combination vaccines. What tests do I need?  Blood tests  Lipid and cholesterol levels. These may be checked every 5 years starting at age 80.  Hepatitis C test.  Hepatitis B test. Screening  Diabetes screening. This is done by checking your blood sugar (glucose) after you have not eaten for a while (fasting).  Sexually transmitted disease (STD) testing.  BRCA-related cancer screening. This may be done if you have a family history of breast, ovarian, tubal, or peritoneal cancers.  Pelvic exam and Pap test. This may be done every 3 years starting at age 9. Starting at age 81, this may be done every 5 years if you have a Pap test in combination with an HPV test. Talk with your health care provider about your test results, treatment options, and if necessary,  the need for more tests. Follow these instructions at home: Eating and drinking   Eat a diet that includes fresh fruits and vegetables, whole grains, lean protein, and low-fat dairy.  Take vitamin and mineral supplements as recommended by your health care provider.  Do not drink alcohol if: ? Your health care provider tells you not to drink. ? You are pregnant, may be pregnant, or are planning to become pregnant.  If you drink alcohol: ? Limit how much you have to 0-1 drink a day. ? Be aware of how much alcohol is in your drink. In the [place] one drink equals one 12 oz bottle of beer (355 mL), one 5 oz glass of wine (148 mL), or one 1 oz glass of hard liquor (44 mL). Lifestyle  Take daily care of your teeth and gums.  Stay active. Exercise for at least 30 minutes on 5 or more days each week.  Do not use any products that contain nicotine or tobacco, such as cigarettes, e-cigarettes, and chewing tobacco. If you need help quitting, ask your  health care provider.  If you are sexually active, practice safe sex. Use a condom or other form of birth control (contraception) in order to prevent pregnancy and STIs (sexually transmitted infections). If you plan to become pregnant, see your health care provider for a preconception visit. What's next?  Visit your health care provider once a year for a well check visit.  Ask your health care provider how often you should have your eyes and teeth checked.  Stay up to date on all vaccines. This information is not intended to replace advice given to you by your health care provider. Make sure you discuss any questions you have with your health care provider. Document Released: 12/30/2001 Document Revised: 07/15/2018 Document Reviewed: 07/15/2018 Elsevier Patient Education  2020 [place] for Polycystic Ovary Syndrome Polycystic ovary syndrome (PCOS) is a disorder of the chemicals (hormones) that regulate a woman's reproductive system,  including monthly periods (menstruation). The condition causes important hormones to be out of balance. PCOS can:  Stop your periods or make them irregular.  Cause cysts to develop on your ovaries.  Make it difficult to get pregnant.  Stop your body from responding to the effects of insulin (insulin resistance). Insulin resistance can lead to obesity and diabetes. Changing what you eat can help you manage PCOS and improve your health. Following a balanced diet can help you lose weight and improve the way that your body uses insulin. What are tips for following this plan?  Follow a balanced diet for meals and snacks. Eat breakfast, lunch, dinner, and one or two snacks every day.  Include protein in each meal and snack.  Choose whole grains instead of products that are made with refined flour.  Eat a variety of foods.  Exercise regularly as told by your health care provider. Aim to do 30 or more minutes of exercise on most days of the week.  If you are overweight or obese: ? Pay attention to how many calories you eat. Cutting down on calories can help you lose weight. ? Work with your health care provider or a diet and nutrition specialist (dietitian) to figure out how many calories you need each day. What foods can I eat?  Fruits Include a variety of colors and types. All fruits are helpful for PCOS. Vegetables Include a variety of colors and types. All vegetables are helpful for PCOS. Grains Whole grains, such as whole wheat. Whole-grain breads, crackers, cereals, and pasta. Unsweetened oatmeal, bulgur, barley, quinoa, and brown rice. Tortillas made from corn or whole-wheat flour. Meats and other proteins Low-fat (lean) proteins, such as fish, chicken, beans, eggs, and tofu. Dairy Low-fat dairy products, such as skim milk, cheese sticks, and yogurt. Beverages Low-fat or fat-free drinks, such as water, low-fat milk, sugar-free drinks, and small amounts of 100% fruit juice.  Seasonings and condiments Ketchup. Mustard. Barbecue sauce. Relish. Low-fat or fat-free mayonnaise. Fats and oils Olive oil or canola oil. Walnuts and almonds. The items listed above may not be a complete list of recommended foods and beverages. Contact a dietitian for more options. What foods are not recommended? Foods that are high in calories or fat. Fried foods. Sweets. Products that are made from refined white flour, including white bread, pastries, white rice, and pasta. The items listed above may not be a complete list of foods and beverages to avoid. Contact a dietitian for more information. Summary  PCOS is a hormonal imbalance that affects a woman's reproductive system.  You can help to manage  your PCOS by exercising regularly and eating a healthy, varied diet of vegetables, fruit, whole grains, low-fat (lean) protein, and low-fat dairy products.  Changing what you eat can improve the way that your body uses insulin, help your hormones reach normal levels, and help you lose weight. This information is not intended to replace advice given to you by your health care provider. Make sure you discuss any questions you have with your health care provider. Document Released: 02/25/2016 Document Revised: 02/23/2019 Document Reviewed: 09/07/2017 Elsevier Patient Education  2020 [employer]. Preparing for Pregnancy If you are considering becoming pregnant, make an appointment to see your regular health care provider to learn how to prepare for a safe and healthy pregnancy (preconception care). During a preconception care visit, your health care provider will:  Do a complete physical exam, including a Pap test.  Take a complete medical history.  Give you information, answer your questions, and help you resolve problems. Preconception checklist Medical history  Tell your health care provider about any current or past medical conditions. Your pregnancy or your ability to become pregnant  may be affected by chronic conditions, such as diabetes, chronic hypertension, and thyroid problems.  Include your family's medical history as well as your partner's medical history.  Tell your health care provider about any history of STIs (sexually transmitted infections).These can affect your pregnancy. In some cases, they can be passed to your baby. Discuss any concerns that you have about STIs.  If indicated, discuss the benefits of genetic testing. This testing will show whether there are any genetic conditions that may be passed from you or your partner to your baby.  Tell your health care provider about: ? Any problems you have had with conception or pregnancy. ? Any medicines you take. These include vitamins, herbal supplements, and over-the-counter medicines. ? Your history of immunizations. Discuss any vaccinations that you may need. Diet  Ask your health care provider what to include in a healthy diet that has a balance of nutrients. This is especially important when you are pregnant or preparing to become pregnant.  Ask your health care provider to help you reach a healthy weight before pregnancy. ? If you are overweight, you may be at higher risk for certain complications, such as high blood pressure, diabetes, and preterm birth. ? If you are underweight, you are more likely to have a baby who has a low birth weight. Lifestyle, work, and home  Let your health care provider know: ? About any lifestyle habits that you have, such as alcohol use, drug use, or smoking. ? About recreational activities that may put you at risk during pregnancy, such as downhill skiing and certain exercise programs. ? Tell your health care provider about any international travel, especially any travel to places with an active [place] virus outbreak. ? About harmful substances that you may be exposed to at work or at home. These include chemicals, pesticides, radiation, or even litter boxes. ? If you do  not feel safe at home. Mental health  Tell your health care provider about: ? Any history of mental health conditions, including feelings of depression, sadness, or anxiety. ? Any medicines that you take for a mental health condition. These include herbs and supplements. Home instructions to prepare for pregnancy Lifestyle   Eat a balanced diet. This includes fresh fruits and vegetables, whole grains, lean meats, low-fat dairy products, healthy fats, and foods that are high in fiber. Ask to meet with a nutritionist or registered dietitian  for assistance with meal planning and goals.  Get regular exercise. Try to be active for at least 30 minutes a day on most days of the week. Ask your health care provider which activities are safe during pregnancy.  Do not use any products that contain nicotine or tobacco, such as cigarettes and e-cigarettes. If you need help quitting, ask your health care provider.  Do not drink alcohol.  Do not take illegal drugs.  Maintain a healthy weight. Ask your health care provider what weight range is right for you. General instructions  Keep an accurate record of your menstrual periods. This makes it easier for your health care provider to determine your baby's due date.  Begin taking prenatal vitamins and folic acid supplements daily as directed by your health care provider.  Manage any chronic conditions, such as high blood pressure and diabetes, as told by your health care provider. This is important. How do I know that I am pregnant? You may be pregnant if you have been sexually active and you miss your period. Symptoms of early pregnancy include:  Mild cramping.  Very light vaginal bleeding (spotting).  Feeling unusually tired.  Nausea and vomiting (morning sickness). If you have any of these symptoms and you suspect that you might be pregnant, you can take a home pregnancy test. These tests check for a hormone in your urine (human chorionic  gonadotropin, or hCG). A woman's body begins to make this hormone during early pregnancy. These tests are very accurate. Wait until at least the first day after you miss your period to take one. If the test shows that you are pregnant (you get a positive result), call your health care provider to make an appointment for prenatal care. What should I do if I become pregnant?      Make an appointment with your health care provider as soon as you suspect you are pregnant.  Do not use any products that contain nicotine, such as cigarettes, chewing tobacco, and e-cigarettes. If you need help quitting, ask your health care provider.  Do not drink alcoholic beverages. Alcohol is related to a number of birth defects.  Avoid toxic odors and chemicals.  You may continue to have sexual intercourse if it does not cause pain or other problems, such as vaginal bleeding. This information is not intended to replace advice given to you by your health care provider. Make sure you discuss any questions you have with your health care provider. Document Released: 10/16/2008 Document Revised: 11/05/2017 Document Reviewed: 05/25/2016 Elsevier Patient Education  2020 [employer].

## 2019-07-16 LAB — URINE CULTURE: Organism ID, Bacteria: NO GROWTH

## 2019-07-18 LAB — CYTOLOGY - PAP
Adequacy: ABSENT
Diagnosis: NEGATIVE
HPV: NOT DETECTED

## 2019-09-16 ENCOUNTER — Other Ambulatory Visit: Payer: Self-pay

## 2019-09-16 ENCOUNTER — Encounter: Payer: Self-pay | Admitting: Certified Nurse Midwife

## 2019-09-16 ENCOUNTER — Other Ambulatory Visit (HOSPITAL_COMMUNITY)
Admission: RE | Admit: 2019-09-16 | Discharge: 2019-09-16 | Disposition: A | Discharge status: Home / Self Care | Payer: 59 | Source: Ambulatory Visit | Attending: Certified Nurse Midwife | Admitting: Certified Nurse Midwife

## 2019-09-16 ENCOUNTER — Ambulatory Visit: Payer: 59 | Admitting: Certified Nurse Midwife

## 2019-09-16 VITALS — BP 92/65 | HR 73 | Ht 62.0 in | Wt 159.3 lb

## 2019-09-16 DIAGNOSIS — N939 Abnormal uterine and vaginal bleeding, unspecified: Secondary | ICD-10-CM

## 2019-09-16 NOTE — Progress Notes (Signed)
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  Patient was on OCP from June through August then stopped.

## 2019-09-16 NOTE — Patient Instructions (Signed)
Preparing for Pregnancy If you are considering becoming pregnant, make an appointment to see your regular health care provider to learn how to prepare for a safe and healthy pregnancy (preconception care). During a preconception care visit, your health care provider will:  Do a complete physical exam, including a Pap test.  Take a complete medical history.  Give you information, answer your questions, and help you resolve problems. Preconception checklist Medical history  Tell your health care provider about any current or past medical conditions. Your pregnancy or your ability to become pregnant may be affected by chronic conditions, such as diabetes, chronic hypertension, and thyroid problems.  Include your family's medical history as well as your partner's medical history.  Tell your health care provider about any history of STIs (sexually transmitted infections).These can affect your pregnancy. In some cases, they can be passed to your baby. Discuss any concerns that you have about STIs.  If indicated, discuss the benefits of genetic testing. This testing will show whether there are any genetic conditions that may be passed from you or your partner to your baby.  Tell your health care provider about: ? Any problems you have had with conception or pregnancy. ? Any medicines you take. These include vitamins, herbal supplements, and over-the-counter medicines. ? Your history of immunizations. Discuss any vaccinations that you may need. Diet  Ask your health care provider what to include in a healthy diet that has a balance of nutrients. This is especially important when you are pregnant or preparing to become pregnant.  Ask your health care provider to help you reach a healthy weight before pregnancy. ? If you are overweight, you may be at higher risk for certain complications, such as high blood pressure, diabetes, and preterm birth. ? If you are underweight, you are more likely to  have a baby who has a low birth weight. Lifestyle, work, and home  Let your health care provider know: ? About any lifestyle habits that you have, such as alcohol use, drug use, or smoking. ? About recreational activities that may put you at risk during pregnancy, such as downhill skiing and certain exercise programs. ? Tell your health care provider about any international travel, especially any travel to places with an active Zika virus outbreak. ? About harmful substances that you may be exposed to at work or at home. These include chemicals, pesticides, radiation, or even litter boxes. ? If you do not feel safe at home. Mental health  Tell your health care provider about: ? Any history of mental health conditions, including feelings of depression, sadness, or anxiety. ? Any medicines that you take for a mental health condition. These include herbs and supplements. Home instructions to prepare for pregnancy Lifestyle   Eat a balanced diet. This includes fresh fruits and vegetables, whole grains, lean meats, low-fat dairy products, healthy fats, and foods that are high in fiber. Ask to meet with a nutritionist or registered dietitian for assistance with meal planning and goals.  Get regular exercise. Try to be active for at least 30 minutes a day on most days of the week. Ask your health care provider which activities are safe during pregnancy.  Do not use any products that contain nicotine or tobacco, such as cigarettes and e-cigarettes. If you need help quitting, ask your health care provider.  Do not drink alcohol.  Do not take illegal drugs.  Maintain a healthy weight. Ask your health care provider what weight range is right for you. General   instructions  Keep an accurate record of your menstrual periods. This makes it easier for your health care provider to determine your baby's due date.  Begin taking prenatal vitamins and folic acid supplements daily as directed by your  health care provider.  Manage any chronic conditions, such as high blood pressure and diabetes, as told by your health care provider. This is important. How do I know that I am pregnant? You may be pregnant if you have been sexually active and you miss your period. Symptoms of early pregnancy include:  Mild cramping.  Very light vaginal bleeding (spotting).  Feeling unusually tired.  Nausea and vomiting (morning sickness). If you have any of these symptoms and you suspect that you might be pregnant, you can take a home pregnancy test. These tests check for a hormone in your urine (human chorionic gonadotropin, or hCG). A woman's body begins to make this hormone during early pregnancy. These tests are very accurate. Wait until at least the first day after you miss your period to take one. If the test shows that you are pregnant (you get a positive result), call your health care provider to make an appointment for prenatal care. What should I do if I become pregnant?      Make an appointment with your health care provider as soon as you suspect you are pregnant.  Do not use any products that contain nicotine, such as cigarettes, chewing tobacco, and e-cigarettes. If you need help quitting, ask your health care provider.  Do not drink alcoholic beverages. Alcohol is related to a number of birth defects.  Avoid toxic odors and chemicals.  You may continue to have sexual intercourse if it does not cause pain or other problems, such as vaginal bleeding. This information is not intended to replace advice given to you by your health care provider. Make sure you discuss any questions you have with your health care provider. Document Released: 10/16/2008 Document Revised: 11/05/2017 Document Reviewed: 05/25/2016 Elsevier Patient Education  [place]. Abnormal Uterine Bleeding Abnormal uterine bleeding means bleeding more than usual from your uterus. It can include:  Bleeding between  periods.  Bleeding after sex.  Bleeding that is heavier than normal.  Periods that last longer than usual.  Bleeding after you have stopped having your period (menopause). There are many problems that may cause this. You should see a doctor for any kind of bleeding that is not normal. Treatment depends on the cause of the bleeding. Follow these instructions at home:  Watch your condition for any changes.  Do not use tampons, douche, or have sex, if your doctor tells you not to.  Change your pads often.  Get regular well-woman exams. Make sure they include a pelvic exam and cervical cancer screening.  Keep all follow-up visits as told by your doctor. This is important. Contact a doctor if:  The bleeding lasts more than one week.  You feel dizzy at times.  You feel like you are going to throw up (nauseous).  You throw up. Get help right away if:  You pass out.  You have to change pads every hour.  You have belly (abdominal) pain.  You have a fever.  You get sweaty.  You get weak.  You passing large blood clots from your vagina. Summary  Abnormal uterine bleeding means bleeding more than usual from your uterus.  There are many problems that may cause this. You should see a doctor for any kind of bleeding that is  not normal.  Treatment depends on the cause of the bleeding. This information is not intended to replace advice given to you by your health care provider. Make sure you discuss any questions you have with your health care provider. Document Released: 08/31/2009 Document Revised: 10/28/2016 Document Reviewed: 10/28/2016 Elsevier Patient Education  2020 [employer].

## 2019-09-16 NOTE — Progress Notes (Signed)
GYN ENCOUNTER NOTE  Subjective:       Lori Savage is a 33 y.o. G62P1001 female is here for gynecologic evaluation of the following issues:  1. Abnormal uterine bleeding  Reports heavy vaginal bleeding with clots that has been happening "for a while".   Patient last seen in office on 07/14/2019 for annual exam and plan was established to regulate menses and cycle track in preparation for trying to conceive.   Patient use birth control from June through August and start cycling tracking the following months.   Patient went to the beach last weekend and didn't want to be on menses while vacationing, so took pills daily from last Thursday through Saturday.   Questions pregnancy.   Denies difficulty breathing or respiratory distress, chest pain, abdominal pain, excessive vaginal bleeding, dysuria, and leg pain or swelling.    Gynecologic History  No LMP recorded (lmp unknown).  Contraception: none  Last Pap: 07/14/2019. Results were: Negative/Negative  Obstetric History  OB History  Gravida Para Term Preterm AB Living  1 1 1     1   SAB TAB Ectopic Multiple Live Births          1    # Outcome Date GA Lbr Len/2nd Weight Sex Delivery Anes PTL Lv  1 Term 03/06/09   6 lb 15 oz (3.147 kg) M CS-Unspec EPI N LIV     Complications: Failure to Progress in Second Stage, Fetal Intolerance    Past Medical History:  Diagnosis Date  . PCOS (polycystic ovarian syndrome)   . Situational depression     Past Surgical History:  Procedure Laterality Date  . CESAREAN SECTION      Current Outpatient Medications on File Prior to Visit  Medication Sig Dispense Refill  . Biotin 1000 MCG tablet Take by mouth.    . Prenatal Vit-Fe Fumarate-FA (MULTIVITAMIN-PRENATAL) 27-0.8 MG TABS tablet Take 1 tablet by mouth daily at 12 noon.     No current facility-administered medications on file prior to visit.     No Known Allergies  Social History   Socioeconomic History  . Marital status:  Single    Spouse name: Not on file  . Number of children: Not on file  . Years of education: Not on file  . Highest education level: Not on file  Occupational History  . Not on file  Social Needs  . Financial resource strain: Not on file  . Food insecurity    Worry: Not on file    Inability: Not on file  . Transportation needs    Medical: Not on file    Non-medical: Not on file  Tobacco Use  . Smoking status: Never Smoker  . Smokeless tobacco: Never Used  Substance and Sexual Activity  . Alcohol use: Yes    Comment: social  . Drug use: No  . Sexual activity: Yes    Birth control/protection: None  Lifestyle  . Physical activity    Days per week: Not on file    Minutes per session: Not on file  . Stress: Not on file  Relationships  . Social Herbalist on phone: Not on file    Gets together: Not on file    Attends religious service: Not on file    Active member of club or organization: Not on file    Attends meetings of clubs or organizations: Not on file    Relationship status: Not on file  . Intimate partner violence  Fear of current or ex partner: Not on file    Emotionally abused: Not on file    Physically abused: Not on file    Forced sexual activity: Not on file  Other Topics Concern  . Not on file  Social History Narrative  . Not on file    Family History  Problem Relation Age of Onset  . Lupus Mother   . Epilepsy Mother   . Breast cancer Maternal Grandmother   . Diabetes Maternal Grandmother   . Ovarian cancer Neg Hx   . Colon cancer Neg Hx     The following portions of the patient's history were reviewed and updated as appropriate: allergies, current medications, past family history, past medical history, past social history, past surgical history and problem list.  Review of Systems  ROS negative except as noted above. Information obtained from patient.   Objective:   BP 92/65   Pulse 73   Ht 5\' 2"  (1.575 m)   Wt 159 lb 4.8 oz  (72.3 kg)   LMP  (LMP Unknown)   BMI 29.14 kg/m    CONSTITUTIONAL: Well-developed, well-nourished female in no acute distress.   PELVIC:  External Genitalia: Normal  Vagina: Normal, Blood present wnl, swab collected  Cervix: Normal   MUSCULOSKELETAL: Normal range of motion. No tenderness.  No cyanosis, clubbing, or edema.  POC Urine Pregnancy Test: Negative   Assessment:   1. Abnormal uterine bleeding  - Beta HCG, Quant - Estradiol - CBC - FSH/LH - Cervicovaginal ancillary only     Plan:   Labs: see orders  Education regarding OCPs and their effects on menstrual cycle  Advised patient to complete the pack she started and then restart cycle tracking  Reviewed red flag symptoms and when to call  RTC as previously scheduled or sooner if needed   , CNM [facility], [facility] 09/16/19 5:51 PM

## 2019-09-17 LAB — CBC
Hematocrit: 36.5 % (ref 34.0–46.6)
Hemoglobin: 11.4 g/dL (ref 11.1–15.9)
MCH: 23.7 pg — ABNORMAL LOW (ref 26.6–33.0)
MCHC: 31.2 g/dL — ABNORMAL LOW (ref 31.5–35.7)
MCV: 76 fL — ABNORMAL LOW (ref 79–97)
Platelets: 372 10*3/uL (ref 150–450)
RBC: 4.82 x10E6/uL (ref 3.77–5.28)
RDW: 14.9 % (ref 11.7–15.4)
WBC: 5.6 10*3/uL (ref 3.4–10.8)

## 2019-09-17 LAB — ESTRADIOL: Estradiol: 83.1 pg/mL

## 2019-09-17 LAB — BETA HCG QUANT (REF LAB): hCG Quant: <1 m[IU]/mL

## 2019-09-17 LAB — FSH/LH
FSH: 5 m[IU]/mL
LH: 12.3 m[IU]/mL

## 2019-09-20 LAB — CERVICOVAGINAL ANCILLARY ONLY
Bacterial Vaginitis (gardnerella): POSITIVE — AB
Candida Glabrata: NEGATIVE
Candida Vaginitis: NEGATIVE
Chlamydia: NEGATIVE
Comment: NEGATIVE
Comment: NEGATIVE
Comment: NEGATIVE
Comment: NEGATIVE
Comment: NEGATIVE
Comment: NORMAL
Neisseria Gonorrhea: NEGATIVE
Trichomonas: NEGATIVE

## 2019-10-07 ENCOUNTER — Ambulatory Visit (INDEPENDENT_AMBULATORY_CARE_PROVIDER_SITE_OTHER): Payer: 59 | Admitting: Certified Nurse Midwife

## 2019-10-07 ENCOUNTER — Encounter: Payer: Self-pay | Admitting: Certified Nurse Midwife

## 2019-10-07 ENCOUNTER — Other Ambulatory Visit: Payer: Self-pay

## 2019-10-07 VITALS — BP 94/67 | HR 95 | Ht 62.0 in | Wt 158.4 lb

## 2019-10-07 DIAGNOSIS — N979 Female infertility, unspecified: Secondary | ICD-10-CM | POA: Diagnosis not present

## 2019-10-07 DIAGNOSIS — Z3169 Encounter for other general counseling and advice on procreation: Secondary | ICD-10-CM

## 2019-10-07 DIAGNOSIS — Z8742 Personal history of other diseases of the female genital tract: Secondary | ICD-10-CM

## 2019-10-07 MED ORDER — CLOMIPHENE CITRATE 50 MG PO TABS: 50.0000 mg | ORAL_TABLET | Freq: Every day | ORAL | 2 refills | Status: DC

## 2019-10-07 MED ORDER — MEDROXYPROGESTERONE ACETATE 10 MG PO TABS: 10.0000 mg | ORAL_TABLET | Freq: Every day | ORAL | 2 refills | Status: DC

## 2019-10-07 NOTE — Progress Notes (Signed)
GYN ENCOUNTER NOTE  Subjective:       Lori Savage is a 33 y.o. G44P1001 female here for follow up regarding abnormal uterine bleeding.   Reports normal menses on OCP, on last pack now.   Trying to conceive after completing this month.   Denies difficulty breathing or respiratory distress, chest pain, abdominal pain, excessive vaginal bleeding, dysuria, and leg pain or swelling.    Gynecologic History  Patient's last menstrual period was 09/06/2019 (exact date). Period Cycle (Days): 28 Period Duration (Days): 6 Period Pattern: Regular Menstrual Flow: Heavy, Light Menstrual Control: Maxi pad Dysmenorrhea: (!) Severe Dysmenorrhea Symptoms: Cramping, Other (Comment)(breast and back pain)  Contraception: OCP (estrogen/progesterone)  Last Pap: 07/14/2019. Results were: Negative/Negative  Obstetric History  OB History  Gravida Para Term Preterm AB Living  1 1 1     1   SAB TAB Ectopic Multiple Live Births          1    # Outcome Date GA Lbr Len/2nd Weight Sex Delivery Anes PTL Lv  1 Term 03/06/09   6 lb 15 oz (3.147 kg) M CS-Unspec EPI N LIV     Complications: Failure to Progress in Second Stage, Fetal Intolerance    Past Medical History:  Diagnosis Date  . PCOS (polycystic ovarian syndrome)   . Situational depression     Past Surgical History:  Procedure Laterality Date  . CESAREAN SECTION      Current Outpatient Medications on File Prior to Visit  Medication Sig Dispense Refill  . Norethindrone-Ethinyl Estradiol-Fe Biphas (LO LOESTRIN FE) 1 MG-10 MCG / 10 MCG tablet Take 1 tablet by mouth daily.     No current facility-administered medications on file prior to visit.     No Known Allergies  Social History   Socioeconomic History  . Marital status: Single    Spouse name: Not on file  . Number of children: Not on file  . Years of education: Not on file  . Highest education level: Not on file  Occupational History  . Not on file  Social Needs  .  Financial resource strain: Not on file  . Food insecurity    Worry: Not on file    Inability: Not on file  . Transportation needs    Medical: Not on file    Non-medical: Not on file  Tobacco Use  . Smoking status: Never Smoker  . Smokeless tobacco: Never Used  Substance and Sexual Activity  . Alcohol use: Yes    Comment: social  . Drug use: No  . Sexual activity: Yes    Birth control/protection: Pill  Lifestyle  . Physical activity    Days per week: Not on file    Minutes per session: Not on file  . Stress: Not on file  Relationships  . Social 03/08/09 on phone: Not on file    Gets together: Not on file    Attends religious service: Not on file    Active member of club or organization: Not on file    Attends meetings of clubs or organizations: Not on file    Relationship status: Not on file  . Intimate partner violence    Fear of current or ex partner: Not on file    Emotionally abused: Not on file    Physically abused: Not on file    Forced sexual activity: Not on file  Other Topics Concern  . Not on file  Social History Narrative  . Not  on file    Family History  Problem Relation Age of Onset  . Lupus Mother   . Epilepsy Mother   . Breast cancer Maternal Grandmother   . Diabetes Maternal Grandmother   . Ovarian cancer Neg Hx   . Colon cancer Neg Hx     The following portions of the patient's history were reviewed and updated as appropriate: allergies, current medications, past family history, past medical history, past social history, past surgical history and problem list.  Review of Systems  ROS negative except as noted above. Information obtained from patient.   Objective:   BP 94/67   Pulse 95   Ht 5\' 2"  (1.575 m)   Wt 158 lb 6.4 oz (71.8 kg)   LMP 09/06/2019 (Exact Date)   BMI 28.97 kg/m  CONSTITUTIONAL: Well-developed, well-nourished female in no acute distress.   PHYSICAL EXAM: Not indicated.   Assessment:   1. History of  PCOS   2. 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 Paper instructions provided.   Reviewed red flag symptoms and when to call.   RTC x 3 months for follow up and referral to [facility] or sooner if needed.    Diona Fanti, CNM [facility], [facility] 10/07/19 4:07 PM

## 2019-10-07 NOTE — Patient Instructions (Signed)
Clomiphene tablets What is this medicine? CLOMIPHENE (KLOE mi feen) is a fertility drug that increases the chance of pregnancy. It helps women ovulate (produce a mature egg) during their cycle. This medicine may be used for other purposes; ask your health care provider or pharmacist if you have questions. COMMON BRAND NAME(S): Clomid, Serophene What should I tell my health care provider before I take this medicine? They need to know if you have any of these conditions:  adrenal gland disease  blood vessel disease or blood clots  cyst on the ovary  endometriosis  liver disease  ovarian cancer  pituitary gland disease  vaginal bleeding that has not been evaluated  an unusual or allergic reaction to clomiphene, other medicines, foods, dyes, or preservatives  pregnant (should not be used if you are already pregnant)  breast-feeding How should I use this medicine? Take this medicine by mouth with a glass of water. Follow the directions on the prescription label. Take exactly as directed for the exact number of days prescribed. Take your doses at regular intervals. Most women take this medicine for a 5 day period, but the length of treatment may be adjusted. Your doctor will give you a start date for this medication and will give you instructions on proper use. Do not take your medicine more often than directed. Talk to your pediatrician regarding the use of this medicine in children. Special care may be needed. Overdosage: If you think you have taken too much of this medicine contact a poison control center or emergency room at once. NOTE: This medicine is only for you. Do not share this medicine with others. What if I miss a dose? If you miss a dose, take it as soon as you can. If it is almost time for your next dose, take only that dose. Do not take double or extra doses. What may interact with this medicine?  herbal or dietary supplements, like blue cohosh, black cohosh,  chasteberry, or DHEA  prasterone This list may not describe all possible interactions. Give your health care provider a list of all the medicines, herbs, non-prescription drugs, or dietary supplements you use. Also tell them if you smoke, drink alcohol, or use illegal drugs. Some items may interact with your medicine. What should I watch for while using this medicine? Make sure you understand how and when to use this medicine. You need to know when you are ovulating and when to have sexual intercourse. This will increase the chance of a pregnancy. Visit your doctor or health care professional for regular checks on your progress. You may need tests to check the hormone levels in your blood or you may have to use home-urine tests to check for ovulation. Try to keep any appointments. Compared to other fertility treatments, this medicine does not greatly increase your chances of having multiple babies. An increased chance of having twins may occur in roughly 5 out of every 100 women who take this medication. Stop taking this medicine at once and contact your doctor or health care professional if you think you are pregnant. This medicine is not for long-term use. Most women that benefit from this medicine do so within the first three cycles (months). Your doctor or health care professional will monitor your condition. This medicine is usually used for a total of 6 cycles of treatment. You may get drowsy or dizzy. Do not drive, use machinery, or do anything that needs mental alertness until you know how this drug affects you. Do  not stand or sit up quickly. This reduces the risk of dizzy or fainting spells. Drinking alcoholic beverages or smoking tobacco may decrease your chance of becoming pregnant. Limit or stop alcohol and tobacco use during your fertility treatments. What side effects may I notice from receiving this medicine? Side effects that you should report to your doctor or health care professional  as soon as possible:  allergic reactions like skin rash, itching or hives, swelling of the face, lips, or tongue  breathing problems  changes in vision  fluid retention  nausea, vomiting  pelvic pain or bloating  severe abdominal pain  sudden weight gain Side effects that usually do not require medical attention (report to your doctor or health care professional if they continue or are bothersome):  breast discomfort  hot flashes  mild pelvic discomfort  mild nausea This list may not describe all possible side effects. Call your doctor for medical advice about side effects. You may report side effects to FDA at 1-800-FDA-1088. Where should I keep my medicine? Keep out of the reach of children. Store at room temperature between 15 and 30 degrees C (59 and 86 degrees F). Protect from heat, light, and moisture. Throw away any unused medicine after the expiration date. NOTE: This sheet is a summary. It may not cover all possible information. If you have questions about this medicine, talk to your doctor, pharmacist, or health care provider.  2020 Elsevier/Gold Standard (2008-02-14 22:21:06) Preparing for Pregnancy If you are considering becoming pregnant, make an appointment to see your regular health care provider to learn how to prepare for a safe and healthy pregnancy (preconception care). During a preconception care visit, your health care provider will:  Do a complete physical exam, including a Pap test.  Take a complete medical history.  Give you information, answer your questions, and help you resolve problems. Preconception checklist Medical history  Tell your health care provider about any current or past medical conditions. Your pregnancy or your ability to become pregnant may be affected by chronic conditions, such as diabetes, chronic hypertension, and thyroid problems.  Include your family's medical history as well as your partner's medical history.  Tell your  health care provider about any history of STIs (sexually transmitted infections).These can affect your pregnancy. In some cases, they can be passed to your baby. Discuss any concerns that you have about STIs.  If indicated, discuss the benefits of genetic testing. This testing will show whether there are any genetic conditions that may be passed from you or your partner to your baby.  Tell your health care provider about: ? Any problems you have had with conception or pregnancy. ? Any medicines you take. These include vitamins, herbal supplements, and over-the-counter medicines. ? Your history of immunizations. Discuss any vaccinations that you may need. Diet  Ask your health care provider what to include in a healthy diet that has a balance of nutrients. This is especially important when you are pregnant or preparing to become pregnant.  Ask your health care provider to help you reach a healthy weight before pregnancy. ? If you are overweight, you may be at higher risk for certain complications, such as high blood pressure, diabetes, and preterm birth. ? If you are underweight, you are more likely to have a baby who has a low birth weight. Lifestyle, work, and home  Let your health care provider know: ? About any lifestyle habits that you have, such as alcohol use, drug use, or smoking. ?  About recreational activities that may put you at risk during pregnancy, such as downhill skiing and certain exercise programs. ? Tell your health care provider about any international travel, especially any travel to places with an active [place] virus outbreak. ? About harmful substances that you may be exposed to at work or at home. These include chemicals, pesticides, radiation, or even litter boxes. ? If you do not feel safe at home. Mental health  Tell your health care provider about: ? Any history of mental health conditions, including feelings of depression, sadness, or anxiety. ? Any medicines  that you take for a mental health condition. These include herbs and supplements. Home instructions to prepare for pregnancy Lifestyle   Eat a balanced diet. This includes fresh fruits and vegetables, whole grains, lean meats, low-fat dairy products, healthy fats, and foods that are high in fiber. Ask to meet with a nutritionist or registered dietitian for assistance with meal planning and goals.  Get regular exercise. Try to be active for at least 30 minutes a day on most days of the week. Ask your health care provider which activities are safe during pregnancy.  Do not use any products that contain nicotine or tobacco, such as cigarettes and e-cigarettes. If you need help quitting, ask your health care provider.  Do not drink alcohol.  Do not take illegal drugs.  Maintain a healthy weight. Ask your health care provider what weight range is right for you. General instructions  Keep an accurate record of your menstrual periods. This makes it easier for your health care provider to determine your baby's due date.  Begin taking prenatal vitamins and folic acid supplements daily as directed by your health care provider.  Manage any chronic conditions, such as high blood pressure and diabetes, as told by your health care provider. This is important. How do I know that I am pregnant? You may be pregnant if you have been sexually active and you miss your period. Symptoms of early pregnancy include:  Mild cramping.  Very light vaginal bleeding (spotting).  Feeling unusually tired.  Nausea and vomiting (morning sickness). If you have any of these symptoms and you suspect that you might be pregnant, you can take a home pregnancy test. These tests check for a hormone in your urine (human chorionic gonadotropin, or hCG). A woman's body begins to make this hormone during early pregnancy. These tests are very accurate. Wait until at least the first day after you miss your period to take one. If  the test shows that you are pregnant (you get a positive result), call your health care provider to make an appointment for prenatal care. What should I do if I become pregnant?      Make an appointment with your health care provider as soon as you suspect you are pregnant.  Do not use any products that contain nicotine, such as cigarettes, chewing tobacco, and e-cigarettes. If you need help quitting, ask your health care provider.  Do not drink alcoholic beverages. Alcohol is related to a number of birth defects.  Avoid toxic odors and chemicals.  You may continue to have sexual intercourse if it does not cause pain or other problems, such as vaginal bleeding. This information is not intended to replace advice given to you by your health care provider. Make sure you discuss any questions you have with your health care provider. Document Released: 10/16/2008 Document Revised: 11/05/2017 Document Reviewed: 05/25/2016 Elsevier Patient Education  2020 [employer].

## 2019-10-07 NOTE — Progress Notes (Signed)
Patient here for follow-up on AUB, on last pack of OCP.  Planning on TTC in the near future.

## 2019-10-10 ENCOUNTER — Telehealth: Payer: Self-pay

## 2019-10-10 NOTE — Telephone Encounter (Signed)
Called patient to notify of denial on PA for Clomid, no answer.  [place].

## 2019-10-10 NOTE — Telephone Encounter (Signed)
Patient returned call, aware PA was denied.  Offered GoodRx discount card and patient will call her pharmacy to see if they will take discount card.  Patient will call back if she needs further assistance.

## 2020-01-13 ENCOUNTER — Encounter: Payer: 59 | Admitting: Certified Nurse Midwife

## 2020-04-19 ENCOUNTER — Encounter: Payer: 59 | Admitting: Certified Nurse Midwife

## 2020-06-01 ENCOUNTER — Ambulatory Visit: Payer: 59 | Admitting: Certified Nurse Midwife

## 2020-06-01 ENCOUNTER — Encounter: Payer: Self-pay | Admitting: Certified Nurse Midwife

## 2020-06-01 ENCOUNTER — Other Ambulatory Visit: Payer: Self-pay

## 2020-06-01 VITALS — BP 116/76 | HR 65 | Ht 62.0 in | Wt 165.4 lb

## 2020-06-01 DIAGNOSIS — R5383 Other fatigue: Secondary | ICD-10-CM | POA: Diagnosis not present

## 2020-06-01 DIAGNOSIS — Z8742 Personal history of other diseases of the female genital tract: Secondary | ICD-10-CM | POA: Diagnosis not present

## 2020-06-01 DIAGNOSIS — R8290 Unspecified abnormal findings in urine: Secondary | ICD-10-CM | POA: Diagnosis not present

## 2020-06-01 DIAGNOSIS — N921 Excessive and frequent menstruation with irregular cycle: Secondary | ICD-10-CM | POA: Diagnosis not present

## 2020-06-01 LAB — POCT URINALYSIS DIPSTICK
Bilirubin, UA: NEGATIVE
Blood, UA: NEGATIVE
Glucose, UA: NEGATIVE
Ketones, UA: NEGATIVE
Leukocytes, UA: NEGATIVE
Nitrite, UA: NEGATIVE
Protein, UA: NEGATIVE
Spec Grav, UA: 1.02 (ref 1.010–1.025)
Urobilinogen, UA: 0.2 E.U./dL
pH, UA: 5 (ref 5.0–8.0)

## 2020-06-01 LAB — POCT URINE PREGNANCY: Preg Test, Ur: NEGATIVE

## 2020-06-01 NOTE — Patient Instructions (Signed)
Diet for Polycystic Ovary Syndrome Polycystic ovary syndrome (PCOS) is a disorder of the chemicals (hormones) that regulate a woman's reproductive system, including monthly periods (menstruation). The condition causes important hormones to be out of balance. PCOS can:  Stop your periods or make them irregular.  Cause cysts to develop on your ovaries.  Make it difficult to get pregnant.  Stop your body from responding to the effects of insulin (insulin resistance). Insulin resistance can lead to obesity and diabetes. Changing what you eat can help you manage PCOS and improve your health. Following a balanced diet can help you lose weight and improve the way that your body uses insulin. What are tips for following this plan?  Follow a balanced diet for meals and snacks. Eat breakfast, lunch, dinner, and one or two snacks every day.  Include protein in each meal and snack.  Choose whole grains instead of products that are made with refined flour.  Eat a variety of foods.  Exercise regularly as told by your health care provider. Aim to do 30 or more minutes of exercise on most days of the week.  If you are overweight or obese: ? Pay attention to how many calories you eat. Cutting down on calories can help you lose weight. ? Work with your health care provider or a diet and nutrition specialist (dietitian) to figure out how many calories you need each day. What foods can I eat?  Fruits Include a variety of colors and types. All fruits are helpful for PCOS. Vegetables Include a variety of colors and types. All vegetables are helpful for PCOS. Grains Whole grains, such as whole wheat. Whole-grain breads, crackers, cereals, and pasta. Unsweetened oatmeal, bulgur, barley, quinoa, and brown rice. Tortillas made from corn or whole-wheat flour. Meats and other proteins Low-fat (lean) proteins, such as fish, chicken, beans, eggs, and tofu. Dairy Low-fat dairy products, such as skim milk,  cheese sticks, and yogurt. Beverages Low-fat or fat-free drinks, such as water, low-fat milk, sugar-free drinks, and small amounts of 100% fruit juice. Seasonings and condiments Ketchup. Mustard. Barbecue sauce. Relish. Low-fat or fat-free mayonnaise. Fats and oils Olive oil or canola oil. Walnuts and almonds. The items listed above may not be a complete list of recommended foods and beverages. Contact a dietitian for more options. What foods are not recommended? Foods that are high in calories or fat. Fried foods. Sweets. Products that are made from refined white flour, including white bread, pastries, white rice, and pasta. The items listed above may not be a complete list of foods and beverages to avoid. Contact a dietitian for more information. Summary  PCOS is a hormonal imbalance that affects a woman's reproductive system.  You can help to manage your PCOS by exercising regularly and eating a healthy, varied diet of vegetables, fruit, whole grains, low-fat (lean) protein, and low-fat dairy products.  Changing what you eat can improve the way that your body uses insulin, help your hormones reach normal levels, and help you lose weight. This information is not intended to replace advice given to you by your health care provider. Make sure you discuss any questions you have with your health care provider. Document Revised: 02/23/2019 Document Reviewed: 09/07/2017 Elsevier Patient Education  2020 [employer]   Tranexamic acid oral tablets What is this medicine? TRANEXAMIC ACID (TRAN ex AM ik AS id) slows down or stops blood clots from being broken down. This medicine is used to treat heavy monthly menstrual bleeding. This medicine may be used  for other purposes; ask your health care provider or pharmacist if you have questions. COMMON BRAND NAME(S): Cyklokapron, Lysteda What should I tell my health care provider before I take this medicine? They need to know if you have any of these  conditions:  bleeding in the brain  blood clotting problems  kidney disease  vision problems  an unusual allergic reaction to tranexamic acid, other medicines, foods, dyes, or preservatives  pregnant or trying to get pregnant  breast-feeding How should I use this medicine? Take this medicine by mouth with a glass of water. Follow the directions on the prescription label. Do not cut, crush, or chew this medicine. You can take it with or without food. If it upsets your stomach, take it with food. Take your medicine at regular intervals. Do not take it more often than directed. Do not stop taking except on your doctor's advice. Do not take this medicine until your period has started. Do not take it for more than 5 days in a row. Do not take this medicine when you do not have your period. Talk to your pediatrician regarding the use of this medicine in children. While this drug may be prescribed for female children as young as 23 years of age for selected conditions, precautions do apply. Overdosage: If you think you have taken too much of this medicine contact a poison control center or emergency room at once. NOTE: This medicine is only for you. Do not share this medicine with others. What if I miss a dose? If you miss a dose, take it when you remember, and then take your next dose at least 6 hours later. Do not take more than 2 tablets at a time to make up for missed doses. What may interact with this medicine? Do not take this medicine with any of the following medications:  estrogens  birth control pills, patches, injections, rings or other devices that contain both an estrogen and a progestin This medicine may also interact with the following medications:  certain medicines used to help your blood clot  tretinoin (taken by mouth) This list may not describe all possible interactions. Give your health care provider a list of all the medicines, herbs, non-prescription drugs, or  dietary supplements you use. Also tell them if you smoke, drink alcohol, or use illegal drugs. Some items may interact with your medicine. What should I watch for while using this medicine? Tell your doctor or healthcare professional if your symptoms do not start to get better or if they get worse. Tell your doctor or healthcare professional if you notice any eye problems while taking this medicine. Your doctor will refer you to an eye doctor who will examine your eyes. What side effects may I notice from receiving this medicine? Side effects that you should report to your doctor or health care professional as soon as possible:  allergic reactions like skin rash, itching or hives, swelling of the face, lips, or tongue  breathing difficulties  changes in vision  sudden or severe pain in the chest, legs, head, or groin  unusually weak or tired Side effects that usually do not require medical attention (report to your doctor or health care professional if they continue or are bothersome):  back pain  headache  muscle or joint aches  sinus and nasal problems  stomach pain  tiredness This list may not describe all possible side effects. Call your doctor for medical advice about side effects. You may report side effects to  FDA at 1-800-FDA-1088. Where should I keep my medicine? Keep out of the reach of children. Store at room temperature between 15 and 30 degrees C (59 and 86 degrees F). Throw away any unused medicine after the expiration date. NOTE: This sheet is a summary. It may not cover all possible information. If you have questions about this medicine, talk to your doctor, pharmacist, or health care provider.  2020 Elsevier/Gold Standard (2015-12-06 09:12:15)   Drospirenone; Ethinyl Estradiol tablets What is this medicine? DROSPIRENONE; ETHINYL ESTRADIOL (dro SPY re nown; ETH in il es tra DYE ole) is an oral contraceptive (birth control pill). This medicine combines two types  of female hormones, an estrogen and a progestin. It is used to prevent ovulation and pregnancy. This medicine may be used for other purposes; ask your health care provider or pharmacist if you have questions. COMMON BRAND NAME(S): Ovidio HangerGianvi, Jasmiel, Lo-Zumandimine, Elmer RampLoryna, Nikki 28-Day, Ocella, Syeda, Vestura, Alphonse GuildYasmin, Yaz, Zarah, Zumandimine What should I tell my health care provider before I take this medicine? They need to know if you have or ever had any of these conditions:  abnormal vaginal bleeding  adrenal gland disease  blood vessel disease or blood clots  breast, cervical, endometrial, ovarian, liver, or uterine cancer  diabetes  gallbladder disease  heart disease or recent heart attack  high blood pressure  high cholesterol  high potassium level  kidney disease  liver disease  migraine headaches  stroke  systemic lupus erythematosus (SLE)  tobacco smoker  an unusual or allergic reaction to estrogens, progestins, or other medicines, foods, dyes, or preservatives  pregnant or trying to get pregnant  breast-feeding How should I use this medicine? Take this medicine by mouth. To reduce nausea, this medicine may be taken with food. Follow the directions on the prescription label. Take this medicine at the same time each day and in the order directed on the package. Do not take your medicine more often than directed. A patient package insert for the product will be given with each prescription and refill. Read this sheet carefully each time. The sheet may change frequently. Talk to your pediatrician regarding the use of this medicine in children. Special care may be needed. This medicine has been used in female children who have started having menstrual periods. Overdosage: If you think you have taken too much of this medicine contact a poison control center or emergency room at once. NOTE: This medicine is only for you. Do not share this medicine with others. What  if I miss a dose? If you miss a dose, refer to the patient information sheet you received with your medicine for direction. If you miss more than one pill, this medicine may not be as effective and you may need to use another form of birth control. What may interact with this medicine? Do not take this medicine with any of the following medications:  aminoglutethimide  amprenavir, fosamprenavir  atazanavir; cobicistat  anastrozole  bosentan  exemestane  letrozole  metyrapone  testolactone This medicine may also interact with the following medications:  acetaminophen  antiviral medicines for HIV or AIDS  aprepitant  barbiturates  certain antibiotics like rifampin, rifabutin, rifapentine, and possibly penicillins or tetracyclines  certain diuretics like amiloride, spironolactone, triamterene  certain medicines for fungal infections like griseofulvin, ketoconazole, itraconazole  certain medications for high blood pressure or heart conditions like ACE-inhibitors, Angiotensin-II receptor blockers, eplerenone  certain medicines for seizures like carbamazepine, oxcarbazepine, phenobarbital, phenytoin  cholestyramine  cobicistat  corticosteroid like hydrocortisone  and prednisolone  cyclosporine  dantrolene  felbamate  grapefruit juice  heparin  lamotrigine  medicines for diabetes, including pioglitazone  modafinil  NSAIDs  potassium supplements  pyrimethamine  raloxifene  St. John's wort  sulfasalazine  tamoxifen  topiramate  thyroid hormones  warfarin his list may not describe all possible interactions. Give your health care provider a list of all the medicines, herbs, non-prescription drugs, or dietary supplements you use. Also tell them if you smoke, drink alcohol, or use illegal drugs. Some items may interact with your medicine. This list may not describe all possible interactions. Give your health care provider a list of all the  medicines, herbs, non-prescription drugs, or dietary supplements you use. Also tell them if you smoke, drink alcohol, or use illegal drugs. Some items may interact with your medicine. What should I watch for while using this medicine? Visit your doctor or health care professional for regular checks on your progress. You will need a regular breast and pelvic exam and Pap smear while on this medicine. Use an additional method of contraception during the first cycle that you take these tablets. If you have any reason to think you are pregnant, stop taking this medicine right away and contact your doctor or health care professional. If you are taking this medicine for hormone related problems, it may take several cycles of use to see improvement in your condition. Smoking increases the risk of getting a blood clot or having a stroke while you are taking birth control pills, especially if you are more than 32 years old. You are strongly advised not to smoke. This medicine can make your body retain fluid, making your fingers, hands, or ankles swell. Your blood pressure can go up. Contact your doctor or health care professional if you feel you are retaining fluid. This medicine can make you more sensitive to the sun. Keep out of the sun. If you cannot avoid being in the sun, wear protective clothing and use sunscreen. Do not use sun lamps or tanning beds/booths. If you wear contact lenses and notice visual changes, or if the lenses begin to feel uncomfortable, consult your eye care specialist. In some women, tenderness, swelling, or minor bleeding of the gums may occur. Notify your dentist if this happens. Brushing and flossing your teeth regularly may help limit this. See your dentist regularly and inform your dentist of the medicines you are taking. If you are going to have elective surgery, you may need to stop taking this medicine before the surgery. Consult your health care professional for advice. This  medicine does not protect you against HIV infection (AIDS) or any other sexually transmitted diseases. What side effects may I notice from receiving this medicine? Side effects that you should report to your doctor or health care professional as soon as possible:  allergic reactions like skin rash, itching or hives, swelling of the face, lips, or tongue  breast tissue changes or discharge  changes in vision  chest pain  confusion, trouble speaking or understanding  dark urine  general ill feeling or flu-like symptoms  light-colored stools  nausea, vomiting  pain, swelling, warmth in the leg  right upper belly pain  severe headaches  shortness of breath  sudden numbness or weakness of the face, arm or leg  trouble walking, dizziness, loss of balance or coordination  unusual vaginal bleeding  yellowing of the eyes or skin Side effects that usually do not require medical attention (report to your doctor or health care  professional if they continue or are bothersome):  acne  brown spots on the face  change in appetite  change in sexual desire  depressed mood or mood swings  fluid retention and swelling  stomach cramps or bloating  unusually weak or tired  weight gain This list may not describe all possible side effects. Call your doctor for medical advice about side effects. You may report side effects to FDA at 1-800-FDA-1088. Where should I keep my medicine? Keep out of the reach of children. Store at room temperature between 15 and 30 degrees C (59 and 86 degrees F). Throw away any unused medicine after the expiration date. NOTE: This sheet is a summary. It may not cover all possible information. If you have questions about this medicine, talk to your doctor, pharmacist, or health care provider.  2020 Elsevier/Gold Standard (2016-07-25 13:52:56)    Levonorgestrel intrauterine device (IUD) What is this medicine? LEVONORGESTREL IUD (LEE voe nor jes  trel) is a contraceptive (birth control) device. The device is placed inside the uterus by a healthcare professional. It is used to prevent pregnancy. This device can also be used to treat heavy bleeding that occurs during your period. This medicine may be used for other purposes; ask your health care provider or pharmacist if you have questions. COMMON BRAND NAME(S): Cameron Ali What should I tell my health care provider before I take this medicine? They need to know if you have any of these conditions:  abnormal Pap smear  cancer of the breast, uterus, or cervix  diabetes  endometritis  genital or pelvic infection now or in the past  have more than one sexual partner or your partner has more than one partner  heart disease  history of an ectopic or tubal pregnancy  immune system problems  IUD in place  liver disease or tumor  problems with blood clots or take blood-thinners  seizures  use intravenous drugs  uterus of unusual shape  vaginal bleeding that has not been explained  an unusual or allergic reaction to levonorgestrel, other hormones, silicone, or polyethylene, medicines, foods, dyes, or preservatives  pregnant or trying to get pregnant  breast-feeding How should I use this medicine? This device is placed inside the uterus by a health care professional. Talk to your pediatrician regarding the use of this medicine in children. Special care may be needed. Overdosage: If you think you have taken too much of this medicine contact a poison control center or emergency room at once. NOTE: This medicine is only for you. Do not share this medicine with others. What if I miss a dose? This does not apply. Depending on the brand of device you have inserted, the device will need to be replaced every 3 to 6 years if you wish to continue using this type of birth control. What may interact with this medicine? Do not take this medicine with any of the  following medications:  amprenavir  bosentan  fosamprenavir This medicine may also interact with the following medications:  aprepitant  armodafinil  barbiturate medicines for inducing sleep or treating seizures  bexarotene  boceprevir  griseofulvin  medicines to treat seizures like carbamazepine, ethotoin, felbamate, oxcarbazepine, phenytoin, topiramate  modafinil  pioglitazone  rifabutin  rifampin  rifapentine  some medicines to treat HIV infection like atazanavir, efavirenz, indinavir, lopinavir, nelfinavir, tipranavir, ritonavir  St. John's wort  warfarin This list may not describe all possible interactions. Give your health care provider a list of all the medicines,  herbs, non-prescription drugs, or dietary supplements you use. Also tell them if you smoke, drink alcohol, or use illegal drugs. Some items may interact with your medicine. What should I watch for while using this medicine? Visit your doctor or health care professional for regular check ups. See your doctor if you or your partner has sexual contact with others, becomes HIV positive, or gets a sexual transmitted disease. This product does not protect you against HIV infection (AIDS) or other sexually transmitted diseases. You can check the placement of the IUD yourself by reaching up to the top of your vagina with clean fingers to feel the threads. Do not pull on the threads. It is a good habit to check placement after each menstrual period. Call your doctor right away if you feel more of the IUD than just the threads or if you cannot feel the threads at all. The IUD may come out by itself. You may become pregnant if the device comes out. If you notice that the IUD has come out use a backup birth control method like condoms and call your health care provider. Using tampons will not change the position of the IUD and are okay to use during your period. This IUD can be safely scanned with magnetic resonance  imaging (MRI) only under specific conditions. Before you have an MRI, tell your healthcare provider that you have an IUD in place, and which type of IUD you have in place. What side effects may I notice from receiving this medicine? Side effects that you should report to your doctor or health care professional as soon as possible:  allergic reactions like skin rash, itching or hives, swelling of the face, lips, or tongue  fever, flu-like symptoms  genital sores  high blood pressure  no menstrual period for 6 weeks during use  pain, swelling, warmth in the leg  pelvic pain or tenderness  severe or sudden headache  signs of pregnancy  stomach cramping  sudden shortness of breath  trouble with balance, talking, or walking  unusual vaginal bleeding, discharge  yellowing of the eyes or skin Side effects that usually do not require medical attention (report to your doctor or health care professional if they continue or are bothersome):  acne  breast pain  change in sex drive or performance  changes in weight  cramping, dizziness, or faintness while the device is being inserted  headache  irregular menstrual bleeding within first 3 to 6 months of use  nausea This list may not describe all possible side effects. Call your doctor for medical advice about side effects. You may report side effects to FDA at 1-800-FDA-1088. Where should I keep my medicine? This does not apply. NOTE: This sheet is a summary. It may not cover all possible information. If you have questions about this medicine, talk to your doctor, pharmacist, or health care provider.  2020 Elsevier/Gold Standard (2018-09-14 13:22:01)

## 2020-06-01 NOTE — Progress Notes (Signed)
GYN ENCOUNTER NOTE  Subjective:       Lori Savage is a 34 y.o. G7P1001 female is here for gynecologic evaluation of the following issues:  1. Menorrhagia with irregular cycle; Dysmenorrhea 2. Malodorous urine 3. Nausea and fatigue  History significant for PCOS. Discussed pregnancy at last visit; however, she never started the medications for ovulation induction since they were not covered by insurance.   Taking Biotic for hair growth at this time.   Uncertain regarding desire for pregnancy at this time.   Denies difficulty breathing or respiratory distress, chest pain, abdominal pain, dysuria, and leg pain or swelling.    Gynecologic History  Patient's last menstrual period was 03/20/2020 (exact date).  Contraception: none  Last Pap: 06/2019. Results were: Neg/Neg  Obstetric History  OB History  Gravida Para Term Preterm AB Living  1 1 1     1   SAB TAB Ectopic Multiple Live Births          1    # Outcome Date GA Lbr Len/2nd Weight Sex Delivery Anes PTL Lv  1 Term 03/06/09   6 lb 15 oz (3.147 kg) M CS-Unspec EPI N LIV     Complications: Failure to Progress in Second Stage, Fetal Intolerance    Past Medical History:  Diagnosis Date   PCOS (polycystic ovarian syndrome)    Situational depression     Past Surgical History:  Procedure Laterality Date   CESAREAN SECTION      Current Outpatient Medications on File Prior to Visit  Medication Sig Dispense Refill   cyclobenzaprine (FLEXERIL) 5 MG tablet Take 5 mg by mouth 3 (three) times daily as needed.     No current facility-administered medications on file prior to visit.    No Known Allergies  Social History   Socioeconomic History   Marital status: Single    Spouse name: Not on file   Number of children: Not on file   Years of education: Not on file   Highest education level: Not on file  Occupational History   Not on file  Tobacco Use   Smoking status: Never Smoker   Smokeless tobacco:  Never Used  Vaping Use   Vaping Use: Never used  Substance and Sexual Activity   Alcohol use: Yes    Comment: social   Drug use: No   Sexual activity: Yes  Other Topics Concern   Not on file  Social History Narrative   Not on file   Social Determinants of Health   Financial Resource Strain:    Difficulty of Paying Living Expenses:   Food Insecurity:    Worried About 03/08/09 in the Last Year:    Programme researcher, broadcasting/film/video in the Last Year:   Transportation Needs:    Barista (Medical):    Lack of Transportation (Non-Medical):   Physical Activity:    Days of Exercise per Week:    Minutes of Exercise per Session:   Stress:    Feeling of Stress :   Social Connections:    Frequency of Communication with Friends and Family:    Frequency of Social Gatherings with Friends and Family:    Attends Religious Services:    Active Member of Clubs or Organizations:    Attends Freight forwarder Meetings:    Marital Status:   Intimate Partner Violence:    Fear of Current or Ex-Partner:    Emotionally Abused:    Physically Abused:    Sexually  Abused:     Family History  Problem Relation Age of Onset   Lupus Mother    Epilepsy Mother    Breast cancer Maternal Grandmother    Diabetes Maternal Grandmother    Ovarian cancer Neg Hx    Colon cancer Neg Hx     The following portions of the patient's history were reviewed and updated as appropriate: allergies, current medications, past family history, past medical history, past social history, past surgical history and problem list.  Review of Systems  ROS negative except as noted above. Information obtained from patient.   Objective:   BP 116/76    Pulse 65    Ht 5\' 2"  (1.575 m)    Wt 165 lb 7 oz (75 kg)    LMP 03/20/2020 (Exact Date)    BMI 30.26 kg/m   CONSTITUTIONAL: Well-developed, well-nourished female in no acute distress.   PHYSICAL EXAM: Not indicated.   Recent Results  (from the past 2160 hour(s))  POCT urine pregnancy     Status: None   Collection Time: 06/01/20  9:14 AM  Result Value Ref Range   Preg Test, Ur Negative Negative  POCT urinalysis dipstick     Status: None   Collection Time: 06/01/20  9:15 AM  Result Value Ref Range   Color, UA yellow    Clarity, UA clear    Glucose, UA Negative Negative   Bilirubin, UA neg    Ketones, UA neg    Spec Grav, UA 1.020 1.010 - 1.025   Blood, UA neg    pH, UA 5.0 5.0 - 8.0   Protein, UA Negative Negative   Urobilinogen, UA 0.2 0.2 or 1.0 E.U./dL   Nitrite, UA neg    Leukocytes, UA Negative Negative   Appearance     Odor    TSH     Status: None   Collection Time: 06/01/20  9:27 AM  Result Value Ref Range   TSH 0.459 0.450 - 4.500 uIU/mL  Vitamin B12     Status: None   Collection Time: 06/01/20  9:27 AM  Result Value Ref Range   Vitamin B-12 490 232 - 1,245 pg/mL  CBC     Status: Abnormal   Collection Time: 06/01/20  9:27 AM  Result Value Ref Range   WBC 5.7 3.4 - 10.8 x10E3/uL   RBC 4.72 3.77 - 5.28 x10E6/uL   Hemoglobin 11.7 11.1 - 15.9 g/dL   Hematocrit 06/03/20 72.0 - 46.6 %   MCV 78 (L) 79 - 97 fL   MCH 24.8 (L) 26.6 - 33.0 pg   MCHC 32.0 31 - 35 g/dL   RDW 94.7 (H) 09.6 - 28.3 %   Platelets 276 150 - 450 x10E3/uL  Beta hCG quant (ref lab)     Status: None   Collection Time: 06/01/20  9:27 AM  Result Value Ref Range   hCG Quant <1 mIU/mL    Comment:                      Female (Non-pregnant)    0 -     5                             (Postmenopausal)  0 -     8                      Female (Pregnant)  Weeks of Gestation                              3                6 -    71                              4               10 -   750                              5              217 -  7138                              6              158 - 31795                              7             [id number] -[id number]                              8            32065 -[id number]                               9            63803 -[phone]                             10            46509 -[id number]                             12            27832 -[id number]                             14            13950 - 62530                             15            12039 - 70971                             16             9040 - [phone]                             17             [phone]  18             8099 - 58176 Roche E CLIA methodology   FSH/LH     Status: None   Collection Time: 06/01/20  9:27 AM  Result Value Ref Range   LH 19.6 mIU/mL    Comment:                     Adult Female:                       Follicular phase      2.4 -  12.6                       Ovulation phase      14.0 -  95.6                       Luteal phase          1.0 -  11.4                       Postmenopausal        7.7 -  58.5    FSH 4.6 mIU/mL    Comment:                     Adult Female:                       Follicular phase      3.5 -  12.5                       Ovulation phase       4.7 -  21.5                       Luteal phase          1.7 -   7.7                       Postmenopausal       25.8 - 134.8   Testosterone, Free, Total, SHBG     Status: None   Collection Time: 06/01/20  9:27 AM  Result Value Ref Range   Testosterone 52 8 - 60 ng/dL   Testosterone, Free 3.2 0.0 - 4.2 pg/mL   Sex Hormone Binding 37.2 24.6 - 122.0 nmol/L  Progesterone     Status: None   Collection Time: 06/01/20  9:27 AM  Result Value Ref Range   Progesterone 0.4 ng/mL    Comment:                      Follicular phase       0.1 -   0.9                      Luteal phase           1.8 -  23.9                      Ovulation phase        0.1 -  12.0                      Pregnant  First trimester    11.0 -  44.3                         Second trimester   25.4 -  83.3                         Third trimester    58.7 - 214.0                      Postmenopausal         0.0 -   0.1    Prolactin     Status: None   Collection Time: 06/01/20  9:27 AM  Result Value Ref Range   Prolactin 8.4 4.8 - 23.3 ng/mL  Estradiol     Status: None   Collection Time: 06/01/20  9:27 AM  Result Value Ref Range   Estradiol 51.4 pg/mL    Comment:                     Adult Female:                       Follicular phase   12.5 -   166.0                       Ovulation phase    85.8 -   498.0                       Luteal phase       43.8 -   211.0                       Postmenopausal     <6.0 -    54.7                     Pregnancy                       1st trimester     215.0 - >4300.0 Roche ECLIA methodology   Hemoglobin A1c     Status: None   Collection Time: 06/01/20  9:27 AM  Result Value Ref Range   Hgb A1c MFr Bld 5.1 4.8 - 5.6 %    Comment:          Prediabetes: 5.7 - 6.4          Diabetes: >6.4          Glycemic control for adults with diabetes: <7.0    Est. average glucose Bld gHb Est-mCnc 100 mg/dL       Assessment:   1. Menorrhagia with irregular cycle - POCT urine pregnancy - TSH - Vitamin B12 - CBC - Beta hCG quant (ref lab) - FSH/LH - Testosterone, Free, Total, SHBG - Progesterone - Prolactin - Estradiol - Hemoglobin A1c - Urine Culture  2. History of PCOS  - FSH/LH - Testosterone, Free, Total, SHBG - Progesterone - Prolactin - Estradiol - Hemoglobin A1c  3. Fatigue, unspecified type  - TSH - Vitamin B12  4. Malodorous urine  - POCT urinalysis dipstick - Urine Culture     Plan:   Labs today, see orders.   Discussed PCOS management options in detail with patient and handouts provided.   Patient to contact CNM if symptoms management desired.   RTC as needed.  Serafina Royals, CNM [facility], [facility]

## 2020-06-03 LAB — FSH/LH
FSH: 4.6 m[IU]/mL
LH: 19.6 m[IU]/mL

## 2020-06-03 LAB — CBC
Hematocrit: 36.6 % (ref 34.0–46.6)
Hemoglobin: 11.7 g/dL (ref 11.1–15.9)
MCH: 24.8 pg — ABNORMAL LOW (ref 26.6–33.0)
MCHC: 32 g/dL (ref 31.5–35.7)
MCV: 78 fL — ABNORMAL LOW (ref 79–97)
Platelets: 276 10*3/uL (ref 150–450)
RBC: 4.72 x10E6/uL (ref 3.77–5.28)
RDW: 15.7 % — ABNORMAL HIGH (ref 11.7–15.4)
WBC: 5.7 10*3/uL (ref 3.4–10.8)

## 2020-06-03 LAB — TESTOSTERONE, FREE, TOTAL, SHBG
Sex Hormone Binding: 37.2 nmol/L (ref 24.6–122.0)
Testosterone, Free: 3.2 pg/mL (ref 0.0–4.2)
Testosterone: 52 ng/dL (ref 8–60)

## 2020-06-03 LAB — ESTRADIOL: Estradiol: 51.4 pg/mL

## 2020-06-03 LAB — HEMOGLOBIN A1C
Est. average glucose Bld gHb Est-mCnc: 100 mg/dL
Hgb A1c MFr Bld: 5.1 % (ref 4.8–5.6)

## 2020-06-03 LAB — VITAMIN B12: Vitamin B-12: 490 pg/mL (ref 232–1245)

## 2020-06-03 LAB — BETA HCG QUANT (REF LAB): hCG Quant: <1 m[IU]/mL

## 2020-06-03 LAB — PROLACTIN: Prolactin: 8.4 ng/mL (ref 4.8–23.3)

## 2020-06-03 LAB — TSH: TSH: 0.459 u[IU]/mL (ref 0.450–4.500)

## 2020-06-03 LAB — PROGESTERONE: Progesterone: 0.4 ng/mL

## 2020-06-05 LAB — URINE CULTURE

## 2020-06-07 ENCOUNTER — Other Ambulatory Visit: Payer: Self-pay | Admitting: Certified Nurse Midwife

## 2020-06-07 DIAGNOSIS — B9620 Unspecified Escherichia coli [E. coli] as the cause of diseases classified elsewhere: Secondary | ICD-10-CM

## 2020-06-07 DIAGNOSIS — N390 Urinary tract infection, site not specified: Secondary | ICD-10-CM

## 2020-06-07 MED ORDER — NITROFURANTOIN MONOHYD MACRO 100 MG PO CAPS: 100.0000 mg | ORAL_CAPSULE | Freq: Two times a day (BID) | ORAL | 0 refills | Status: AC

## 2020-06-07 NOTE — Progress Notes (Signed)
Rx Macrobid, see orders.    Serafina Royals, CNM [facility], [facility] 06/07/20  4:50 AM

## 2020-10-17 ENCOUNTER — Telehealth: Payer: Self-pay

## 2020-10-17 ENCOUNTER — Other Ambulatory Visit: Payer: Self-pay | Admitting: Certified Nurse Midwife

## 2020-10-17 NOTE — Telephone Encounter (Signed)
mychart message sent

## 2020-10-18 ENCOUNTER — Other Ambulatory Visit: Payer: Self-pay

## 2020-10-18 NOTE — Telephone Encounter (Signed)
Called pt due to no reply via mychart. Pt states she has urinary urgency/frequency on and off. Offered her an appointment for tomorrow to drop off a urine sample. Pt stated she is unable to due to her work schedule. Informed her she may try Azo otc. Pt states that does not work for her.  Informed her to monitor her symptoms and let us know via mychart.Pt verbalized understanding.

## 2021-04-01 ENCOUNTER — Encounter: Payer: Self-pay | Admitting: Certified Nurse Midwife

## 2021-04-01 ENCOUNTER — Other Ambulatory Visit: Payer: Self-pay

## 2021-04-01 ENCOUNTER — Ambulatory Visit: Payer: 59 | Admitting: Certified Nurse Midwife

## 2021-04-01 VITALS — BP 102/71 | HR 77 | Resp 16 | Ht 63.0 in | Wt 167.1 lb

## 2021-04-01 DIAGNOSIS — Z8742 Personal history of other diseases of the female genital tract: Secondary | ICD-10-CM

## 2021-04-01 DIAGNOSIS — N921 Excessive and frequent menstruation with irregular cycle: Secondary | ICD-10-CM

## 2021-04-01 DIAGNOSIS — R5383 Other fatigue: Secondary | ICD-10-CM | POA: Diagnosis not present

## 2021-04-01 DIAGNOSIS — Z319 Encounter for procreative management, unspecified: Secondary | ICD-10-CM

## 2021-04-01 NOTE — Progress Notes (Signed)
GYN ENCOUNTER NOTE  Subjective:       Lori Savage is a 35 y.o. G31P1001 female is here for gynecologic evaluation of the following issues:   1. Reports heavy irregular cycles with fatigue, notes inconsistent use of OCP-not currently taking   2. Desires to become pregnant  Denies difficulty breathing or respiratory distress, chest pain, and/or leg pain or swelling.  Gynecologic History  Patient's last menstrual period was 03/26/2021 (exact date).  Period Cycle (Days):  (sporactic) Period Pattern: (!) Irregular Menstrual Flow: Heavy Menstrual Control: Maxi pad (She uses about 8-9 pads daily.) Dysmenorrhea: (!) Severe Dysmenorrhea Symptoms: Cramping,Throbbing,Nausea,Diarrhea,Headache (breast tenderness, back pain)  Contraception: none   Last Pap: 07/14/2019. Results were: Neg/Neg   Obstetric History  OB History  Gravida Para Term Preterm AB Living  1 1 1     1   SAB IAB Ectopic Multiple Live Births          1    # Outcome Date GA Lbr Len/2nd Weight Sex Delivery Anes PTL Lv  1 Term 03/06/09   3147 g M CS-Unspec EPI N LIV     Complications: Failure to Progress in Second Stage, Fetal Intolerance    Past Medical History:  Diagnosis Date  . PCOS (polycystic ovarian syndrome)   . Situational depression     Past Surgical History:  Procedure Laterality Date  . CESAREAN SECTION      Current Outpatient Medications on File Prior to Visit  Medication Sig Dispense Refill  . cyclobenzaprine (FLEXERIL) 5 MG tablet Take 5 mg by mouth 3 (three) times daily as needed.    03/08/09 spironolactone (ALDACTONE) 100 MG tablet Take 100 mg by mouth 2 (two) times daily.     No current facility-administered medications on file prior to visit.    No Known Allergies  Social History   Socioeconomic History  . Marital status: Single    Spouse name: Not on file  . Number of children: Not on file  . Years of education: Not on file  . Highest education level: Not on file  Occupational  History  . Not on file  Tobacco Use  . Smoking status: Never Smoker  . Smokeless tobacco: Never Used  Vaping Use  . Vaping Use: Never used  Substance and Sexual Activity  . Alcohol use: Yes    Comment: social  . Drug use: No  . Sexual activity: Yes  Other Topics Concern  . Not on file  Social History Narrative  . Not on file   Social Determinants of Health   Financial Resource Strain: Not on file  Food Insecurity: Not on file  Transportation Needs: Not on file  Physical Activity: Not on file  Stress: Not on file  Social Connections: Not on file  Intimate Partner Violence: Not on file    Family History  Problem Relation Age of Onset  . Lupus Mother   . Epilepsy Mother   . Breast cancer Maternal Grandmother   . Diabetes Maternal Grandmother   . Ovarian cancer Neg Hx   . Colon cancer Neg Hx     The following portions of the patient's history were reviewed and updated as appropriate: allergies, current medications, past family history, past medical history, past social history, past surgical history and problem list.  Review of Systems ROS- Negative other than what was reported above. Information obtained verbally from patient.   Objective:   BP 102/71   Pulse 77   Resp 16   Ht 5\' 3"  (  1.6 m)   Wt 75.8 kg   LMP 03/26/2021 (Exact Date)   BMI 29.60 kg/m    CONSTITUTIONAL: Well-developed, well-nourished female in no acute distress.   PELVIC:   VULVA: normal appearing vulva with no masses, tenderness or lesions  VAGINA: normal appearing vagina with normal color and discharge, no lesions  CERVIX: normal appearing cervix without discharge or lesions.  MUSCULOSKELETAL: Normal range of motion.   Assessment:   1. History of PCOS  2. Menorrhagia with irregular cycle  3. Fatigue, unspecified type  5. Encounter for preconception consultation  Plan:   Discussed taking prenatal vitamin with DHA and folic acid. Encouraged PCOS diet and exercise. Answered  questions, reassurance provided.  Patient saw fertility over 3 years ago, desires referral to a different fertility clinic  Labs : see orders  Reviewed red flag symptoms and when to call.  RTC for labs; Schedule Ultrasound or sooner if needed.  Juliann Pares, Student-MidWife [employer] 04/01/21 9:11 AM

## 2021-04-01 NOTE — Patient Instructions (Addendum)
Female Infertility  Female infertility refers to a woman's inability to get pregnant (conceive) after a year of having sex regularly (or after 6 months in women over age 34) without using birth control. Infertility can also mean that a woman is not able to carry a pregnancy to full term. Both women and men can have fertility problems. What are the causes? This condition may be caused by:  Problems with reproductive organs. Infertility can result if a woman: ? Has an abnormally short cervix or a cervix that does not remain closed during a pregnancy. ? Has a blockage or scarring in the fallopian tubes. ? Has an abnormally shaped uterus. ? Has uterine fibroids. This is a benign mass of tissue or muscle (tumor) that can develop in the uterus. ? Is not ovulating in a regular way.  Certain medical conditions. These may include: ? Polycystic ovary syndrome (PCOS). This is a hormonal disorder that can cause small cysts to grow on the ovaries. This is the most common cause of infertility in women. ? Endometriosis. This is a condition in which the tissue that lines the uterus (endometrium) grows outside of its normal location. ? Cancer and cancer treatments, such as chemotherapy or radiation. ? Premature ovarian failure. This is when ovaries stop producing eggs and hormones before age 43. ? Sexually transmitted diseases, such as chlamydia or gonorrhea. ? Autoimmune disorders. These are disorders in which the body's defense system (immune system) attacks normal, healthy cells. Infertility can be linked to more than one cause. For some women, the cause of infertility is not known (unexplained infertility). What increases the risk?  Age. A woman's fertility declines with age, especially after her mid-62s.  Being underweight or overweight.  Drinking too much alcohol.  Using drugs such as anabolic steroids, cocaine, and marijuana.  Exercising excessively.  Being exposed to environmental toxins,  such as radiation, pesticides, and certain chemicals. What are the signs or symptoms? The main sign of infertility in women is the inability to get pregnant or carry a pregnancy to full term. How is this diagnosed? This condition may be diagnosed by:  Checking whether you are ovulating each month. The tests may include: ? Blood tests to check hormone levels. ? An ultrasound of the ovaries. ? Taking a small tissue that lines the uterus and checking it under a microscope (endometrial biopsy).  Doing additional tests. This is done if ovulation is normal. Tests may include: ? Hysterosalpingography. This X-ray test can show the shape of the uterus and whether the fallopian tubes are open. ? Laparoscopy. This test uses a lighted tube (laparoscope) to look for problems in the fallopian tubes and other organs. ? Transvaginal ultrasound. This imaging test is used to check for abnormalities in the uterus and ovaries. ? Hysteroscopy. This test uses a lighted tube to check for problems in the cervix and the uterus. To be diagnosed with infertility, both partners will have a physical exam. Both partners will also have an extensive medical and sexual history taken. Additional tests may be done. How is this treated? Treatment depends on the cause of infertility. Most cases of infertility in women are treated with medicine or surgery.  Women may take medicine to: ? Correct ovulation problems. ? Treat other health conditions.  Surgery may be done to: ? Repair damage to the ovaries, fallopian tubes, cervix, or uterus. ? Remove growths from the uterus. ? Remove scar tissue from the uterus, pelvis, or other organs. Assisted reproductive technology (ART) Assisted reproductive technology (  ART) refers to all treatments and procedures that combine eggs and sperm outside the body to try to help a couple conceive. ART is often combined with fertility drugs to stimulate ovulation. Sometimes ART is done using eggs  retrieved from another woman's body (donor eggs) or from previously frozen fertilized eggs (embryos). There are different types of ART. These include:  Intrauterine insemination (IUI). A long, thin tube is used to place sperm directly into a woman's uterus. This procedure: ? Is effective for infertility caused by sperm problems, including low sperm count and low motility. ? Can be used in combination with fertility drugs.  In vitro fertilization (IVF). This is done when a woman's fallopian tubes are blocked or when a man has low sperm count. In this procedure: ? Fertility drugs are used to stimulate the ovaries to produce multiple eggs. ? Once mature, these eggs are removed from the body and combined with the sperm to be fertilized. ? The fertilized eggs are then placed into the woman's uterus. Follow these instructions at home:  Take over-the-counter and prescription medicines only as told by your health care provider.  Do not use any products that contain nicotine or tobacco, such as cigarettes and e-cigarettes. If you need help quitting, ask your health care provider.  If you drink alcohol, limit how much you have to 1 drink a day.  Make dietary changes to lose weight or maintain a healthy weight. Work with your health care provider and a dietitian to set a weight-loss goal that is healthy and reasonable for you.  Seek support from a counselor or support group to talk about your concerns related to infertility. Couples counseling may be helpful for you and your partner.  Practice stress reduction techniques that work well for you, such as regular physical activity, meditation, or deep breathing.  Keep all follow-up visits as told by your health care provider. This is important. Contact a health care provider if you:  Feel that stress is interfering with your life and relationships.  Have side effects from treatments for infertility. Summary  Female infertility refers to a woman's  inability to get pregnant (conceive) after a year of having sex regularly (or after 6 months in women over age 49) without using birth control.  To be diagnosed with infertility, both partners will have a physical exam. Both partners will also have an extensive medical and sexual history taken.  Seek support from a counselor or support group to talk about your concerns related to infertility. Couples counseling may be helpful for you and your partner. This information is not intended to replace advice given to you by your health care provider. Make sure you discuss any questions you have with your health care provider. Document Revised: 08/16/2020 Document Reviewed: 07/06/2020 Elsevier Patient Education  [place].   Preparing for Pregnancy If you are planning to become pregnant, talk to your health care provider about preconception care. This type of care helps you prepare for a safe and healthy pregnancy. During this visit, your health care provider will:  Do a complete physical exam, including a Pap test.  Take your complete medical history.  Give you information, answer your questions, and help you resolve problems. Preconception checklist Medical history  Tell your health care provider about any medical conditions you have or have had. Your pregnancy or your ability to become pregnant may be affected by long-term (chronic) conditions, such as: ? Diabetes. ? High blood pressure (hypertension). ? Thyroid problems.  Tell  your health care provider about your family's medical history and your partner's medical history.  Tell your health care provider if you have or have had any sexually transmitted infections, orSTIs. These can affect your pregnancy. In some cases, they can be passed to your baby.  If needed, discuss the benefits of genetic testing. This test checks for conditions that may be passed from parent to child.  Tell your health care provider about: ? Any problems  you had getting pregnant or while pregnant. ? Any medicines you take. These include vitamins, herbal supplements, and over-the-counter medicines. ? Your history of getting vaccines. Discuss any vaccines that you may need. Diet  Ask your health care provider about what foods to eat in order to get a balance of nutrients. This is especially important when you are pregnant or preparing to become pregnant. It is recommended that women of childbearing age take a folic acid supplement of 400 mcg daily and eat foods rich in folic acid to prevent certain birth defects.  Ask your health care provider to help you reach a healthy weight before pregnancy. ? If you are overweight, you may have a higher risk for certain problems. These include hypertension, diabetes, and early (preterm) birth. ? If you are underweight, you are more likely to have a baby who has a low birth weight. Lifestyle, work, and home Let your health care provider know about:  Any lifestyle habits that you have, such as use of alcohol, drugs, or tobacco products.  Fun and leisure activities that may put you at risk during pregnancy, such as downhill skiing and certain exercise programs.  Any plans to travel out of the country, especially to places with an active [place] virus outbreak.  Harmful substances that you may be exposed to at work or at home. These include chemicals, pesticides, radiation, and substances from cat litter boxes.  Any concerns you have for your safety at home. Mental health Tell your health care provider about:  Any history of mental health conditions, including feelings of depression, sadness, or anxiety.  Any medicines that you take for a mental health condition. These include herbs and supplements. How do I know that I am pregnant? You may be pregnant if you have been sexually active and you miss your period. Other symptoms of early pregnancy include:  Mild cramping.  Very light vaginal bleeding  (spotting).  Feeling more tired than usual.  Nausea and vomiting. These may be signs of morning sickness. Take a home pregnancy test if you have any of these symptoms. This test checks for a hormone in your urine called human chorionic gonadotropin, or hCG. A woman's body begins to make this hormone during early pregnancy. These tests are very accurate. Wait until at least the first day after you miss your period to take a home pregnancy test. If the test shows that you are pregnant, call your health care provider for a prenatal care visit. What should I do if I become pregnant?  Schedule a visit with your health care provider as soon as you suspect you are pregnant.  Talk to your health care provider if you are taking prescription medicines to determine if they are safe to take during pregnancy.  You may continue to have sex if it does not cause pain or other problems, such as vaginal bleeding. Follow these instructions at home: Eating and drinking  Follow instructions from your health care provider about eating or drinking restrictions.  Drink enough fluid to keep your  urine pale yellow.  Eat a balanced diet. This includes fresh fruits and vegetables, whole grains, lean meats, low-fat dairy products, healthy fats, and foods that are high in fiber. Ask to meet with a nutritionist or registered dietitian for help with meal planning and goals.  Avoid eating raw or undercooked meat and seafood.  Avoid eating or drinking unpasteurized dairy products.   Lifestyle  Get regular exercise. Try to be active for at least 30 minutes a day on most days of the week. Ask your health care provider which activities are safe during pregnancy.  Maintain a healthy weight.  Avoid toxic fumes and chemicals.  Avoid cleaning cat litter boxes. Cat feces may contain a harmful parasite called toxoplasma.  Avoid travel to countries where [place] virus is common.  Do not use any products that contain nicotine  or tobacco, such as cigarettes, e-cigarettes, and chewing tobacco. If you need help quitting, ask your health care provider.  Do not drink alcohol or use drugs.      General instructions  Keep an accurate record of your menstrual periods. This makes it easier for your health care provider to determine your baby's due date.  Take over-the-counter and prescription medicines only as told by your health care provider.  Begin taking prenatal vitamins and folic acid supplements daily as directed.  Manage any chronic conditions, such as hypertension and diabetes, as told by your health care provider. This is important. Summary  If you are planning to become pregnant, talk to your health care provider about preconception care. This is an important part of planning for a healthy pregnancy.  Women of childbearing age should take 235 mcg of folic acid daily in addition to eating a diet rich in folic acid. This will prevent certain birth defects.  Schedule a visit with your health care provider as soon as you suspect you are pregnant. Tell your health care provider about your medical history, lifestyle activities, home safety, and other things that may concern you. This information is not intended to replace advice given to you by your health care provider. Make sure you discuss any questions you have with your health care provider. Document Revised: 08/03/2019 Document Reviewed: 08/03/2019 Elsevier Patient Education  2021 [place].   Menorrhagia Menorrhagia is a form of abnormal uterine bleeding in which menstrual periods are heavy or last longer than normal. With menorrhagia, the periods may cause enough blood loss and cramping that a woman becomes unable to take part in her usual activities. What are the causes? Common causes of this condition include:  Polyps or fibroids. These are noncancerous growths in the uterus.  An imbalance of the hormones estrogen and  progesterone.  Anovulation, which occurs when one of the ovaries does not release an egg during one or more months.  A problem with the thyroid gland (hypothyroidism).  Side effects of having an intrauterine device (IUD).  Side effects of some medicines, such as NSAIDs or blood thinners.  A bleeding disorder that stops the blood from clotting normally. In some cases, the cause of this condition is not known. What increases the risk? You are more likely to develop this condition if you have cancer of the uterus. What are the signs or symptoms? Symptoms of this condition include:  Routinely having to change your pad or tampon every 1-2 hours because it is soaked.  Needing to use pads and tampons at the same time because of heavy bleeding.  Needing to wake up to change your  pads or tampons during the night.  Passing blood clots larger than 1 inch (2.5 cm) in size.  Having bleeding that lasts for more than 7 days.  Having symptoms of low iron levels (anemia), such as tiredness (fatigue) or shortness of breath. How is this diagnosed? This condition may be diagnosed based on:  A physical exam.  Your symptoms and menstrual history.  Tests, such as: ? Blood tests to check if you are pregnant or if you have hormonal changes, a bleeding or thyroid disorder, anemia, or other problems. ? Pap test to check for cancerous changes, infections, or inflammation. ? Endometrial biopsy. This test involves removing a tissue sample from the lining of the uterus (endometrium) to be examined under a microscope. ? Pelvic ultrasound. This test uses sound waves to create images of your uterus, ovaries, and vagina. The images can show if you have fibroids or other growths. ? Hysteroscopy. For this test, a thin, flexible tube with a light on the end (hysteroscope) is used to look inside your uterus. How is this treated? Treatment may not be needed for this condition. If it is needed, the best treatment  for you will depend on:  Whether you need to prevent pregnancy.  Your desire to have children in the future.  The cause and severity of your bleeding.  Your personal preference. Medicine Medicines are the first step in treatment. You may be treated with:  Hormonal birth control methods. These treatments reduce bleeding during your menstrual period. They include: ? Birth control pills. ? Skin patch. ? Vaginal ring. ? Shots (injections) that you get every 3 months. ? Hormonal IUD. ? Implants that go under the skin.  Medicines that thicken the blood and slow bleeding.  Medicines that reduce swelling, such as ibuprofen.  Medicines that contain an artificial (synthetic) hormone called progestin.  Medicines that make the ovaries stop working for a short time.  Iron supplements to treat anemia.   Surgery If medicines do not work, surgery may be done. Surgical options may include:  Dilation and curettage (D&C). In this procedure, your health care provider opens the lowest part of the uterus (cervix) and then scrapes or suctions tissue from the endometrium. This reduces menstrual bleeding.  Operative hysteroscopy. In this procedure, a hysteroscope is used to view your uterus and help remove polyps that may be causing heavy periods.  Endometrial ablation. This is when various techniques are used to permanently destroy your entire endometrium. After endometrial ablation, most women have little or no menstrual flow. This procedure reduces your ability to become pregnant.  Endometrial resection. In this procedure, an electrosurgical wire loop is used to remove the endometrium. This procedure reduces your ability to become pregnant.  Hysterectomy. This is surgical removal of your uterus. This is a permanent procedure that stops menstrual periods. Pregnancy is not possible after a hysterectomy. Follow these instructions at home: Medicines  Take over-the-counter and prescription medicines  only as told by your health care provider. This includes iron pills.  Do not change or switch medicines without asking your health care provider.  Do not take aspirin or medicines that contain aspirin 1 week before or during your menstrual period. Aspirin may make bleeding worse. Managing constipation Your iron pills may cause constipation. If you are taking prescription iron supplements, you may need to take these actions to prevent or treat constipation:  Drink enough fluid to keep your urine pale yellow.  Take over-the-counter or prescription medicines.  Eat foods that are high  in fiber, such as beans, whole grains, and fresh fruits and vegetables.  Limit foods that are high in fat and processed sugars, such as fried or sweet foods. General instructions  If you need to change your sanitary pad or tampon more than once every 2 hours, limit your activity until the bleeding stops.  Eat well-balanced meals, including foods that are high in iron. Foods that have a lot of iron include leafy green vegetables, meat, liver, eggs, and whole-grain breads and cereals.  Do not try to lose weight until the abnormal bleeding has stopped and your blood iron level is back to normal. If you need to lose weight, work with your health care provider to lose weight safely.  Keep all follow-up visits. This is important. Contact a health care provider if:  You soak through a pad or tampon every 1 or 2 hours, and this happens every time you have a period.  You need to use pads and tampons at the same time because you are bleeding so much.  You have nausea, vomiting, diarrhea, or other problems related to medicines you are taking. Get help right away if:  You soak through more than a pad or tampon in 1 hour.  You pass clots bigger than 1 inch (2.5 cm) wide.  You feel short of breath.  You feel like your heart is beating too fast.  You feel dizzy or you faint.  You feel very weak or  tired. Summary  Menorrhagia is a form of abnormal uterine bleeding in which menstrual periods are heavy or last longer than normal.  Treatment may not be needed for this condition. If it is needed, it may include medicines or procedures.  Take over-the-counter and prescription medicines only as told by your health care provider. This includes iron pills.  Get help right away if you have heavy bleeding that soaks through more than a pad or tampon in 1 hour, you pass large clots, or you feel dizzy, short of breath, or very weak or tired. This information is not intended to replace advice given to you by your health care provider. Make sure you discuss any questions you have with your health care provider. Document Revised: 07/17/2020 Document Reviewed: 07/17/2020 Elsevier Patient Education  2021 [place].   Polycystic Ovary Syndrome  Polycystic ovarian syndrome (PCOS) is a common hormonal disorder among women of reproductive age. In most women with PCOS, small fluid-filled sacs (cysts) grow on the ovaries. PCOS can cause problems with menstrual periods and make it hard to get and stay pregnant. If this condition is not treated, it can lead to serious health problems, such as diabetes and heart disease. What are the causes? The cause of this condition is not known. It may be due to certain factors, such as:  Irregular menstrual cycle.  High levels of certain hormones.  Problems with the hormone that helps to control blood sugar (insulin).  Certain genes. What increases the risk? You are more likely to develop this condition if you:  Have a family history of PCOS or type 2 diabetes.  Are overweight, eat unhealthy foods, and are not active. These factors may cause problems with blood sugar control, which can contribute to PCOS or PCOS symptoms. What are the signs or symptoms? Symptoms of this condition include:  Ovarian cysts and sometimes pelvic pain.  Menstrual periods that  are not regular or are too heavy.  Inability to get or stay pregnant.  Increased growth of hair on the  face, chest, stomach, back, thumbs, thighs, or toes.  Acne or oily skin. Acne may develop during adulthood, and it may not get better with treatment.  Weight gain or obesity.  Patches of thickened and dark brown or black skin on the neck, arms, breasts, or thighs. How is this diagnosed? This condition is diagnosed based on:  Your medical history.  A physical exam that includes a pelvic exam. Your health care provider may look for areas of increased hair growth on your skin.  Tests, such as: ? An ultrasound to check the ovaries for cysts and to view the lining of the uterus. ? Blood tests to check levels of sugar (glucose), female hormone (testosterone), and female hormones (estrogen and progesterone). How is this treated? There is no cure for this condition, but treatment can help to manage symptoms and prevent more health problems from developing. Treatment varies depending on your symptoms and if you want to have a baby or if you need birth control. Treatment may include:  Making nutrition and lifestyle changes.  Taking the progesterone hormone to start a menstrual period.  Taking birth control pills to help you have regular menstrual periods.  Taking medicines such as: ? Medicines to make you ovulate, if you want to get pregnant. ? Medicine to reduce extra hair growth.  Having surgery in severe cases. This may involve making small holes in one or both of your ovaries. This decreases the amount of testosterone that your body makes. Follow these instructions at home:  Take over-the-counter and prescription medicines only as told by your health care provider.  Follow a healthy meal plan that includes lean proteins, complex carbohydrates, fresh fruits and vegetables, low-fat dairy products, healthy fats, and fiber.  If you are overweight, lose weight as told by your health  care provider. Your health care provider can determine how much weight loss is best for you and can help you lose weight safely.  Keep all follow-up visits. This is important. Contact a health care provider if:  Your symptoms do not get better with medicine.  Your symptoms get worse or you develop new symptoms. Summary  Polycystic ovarian syndrome (PCOS) is a common hormonal disorder among women of reproductive age.  PCOS can cause problems with menstrual periods and make it hard to get and stay pregnant.  If this condition is not treated, it can lead to serious health problems, such as diabetes and heart disease.  There is no cure for this condition, but treatment can help to manage symptoms and prevent more health problems from developing. This information is not intended to replace advice given to you by your health care provider. Make sure you discuss any questions you have with your health care provider. Document Revised: 04/12/2020 Document Reviewed: 04/12/2020 Elsevier Patient Education  [place].   Diet for Polycystic Ovary Syndrome Polycystic ovary syndrome (PCOS) is a common hormonal disorder that affects a woman's reproductive system. It can cause problems with menstrual periods and make it hard to get and stay pregnant. Changing what you eat can help your hormones reach normal levels, improve your health, and help you better manage PCOS. Following a balanced diet can help you lose weight and improve the way that your body uses the hormone insulin to control blood sugar. This may include:  Eating low-fat (lean) proteins, complex carbohydrates, fresh fruits and vegetables, low-fat dairy products, healthy fats, and fiber.  Cutting down on calories.  Exercising regularly. What are tips for following this plan?  Follow a balanced diet for meals and snacks. Eat breakfast, lunch, dinner, and one or two snacks every day.  Include protein in each meal and  snack.  Choose whole grains instead of products that are made with refined flour.  Eat a variety of foods.  Exercise regularly as told by your health care provider. Aim to do at least 30 minutes of exercise on most days of the week.  If you are overweight or obese: ? Pay attention to how many calories you eat. Cutting down on calories can help you lose weight. ? Work with your health care provider or a dietitian to figure out how many calories you need each day. What foods should I eat? Fruits Include a variety of colors and types. All fruits are helpful for PCOS. Vegetables Include a variety of colors and types. All vegetables are helpful for PCOS. Grains Whole grains, such as whole wheat. Whole-grain breads, crackers, cereals, and pasta. Unsweetened oatmeal. Bulgur, barley, quinoa, and brown rice. Tortillas made from corn or whole-wheat flour. Meats and other proteins Lean proteins, such as fish, chicken, beans, eggs, and tofu. Dairy Low-fat dairy products, such as skim milk, cheese sticks, and yogurt. Beverages Low-fat or fat-free drinks, such as water, low-fat milk, sugar-free drinks, and small amounts of 100% fruit juice. Seasonings and condiments Ketchup. Mustard. Barbecue sauce. Relish. Low-fat or fat-free mayonnaise. Fats and oils Olive oil or canola oil. Walnuts and almonds. The items listed above may not be a complete list of recommended foods and beverages. Contact a dietitian for more options.   What foods should I avoid? Foods that are high in calories or fat, especially saturated or trans fats. Fried foods. Sweets. Products that are made from refined white flour, including white bread, pastries, white rice, and pasta. The items listed above may not be a complete list of foods and beverages to avoid. Contact a dietitian for more information. Summary  PCOS is a hormonal imbalance that affects a woman's reproductive system. It can cause problems with menstrual periods and  make it hard to get and stay pregnant.  You can help to manage your PCOS by exercising regularly and eating a healthy, varied diet of vegetables, fruit, whole grains, lean protein, and low-fat dairy products.  Changing what you eat can improve the way that your body uses insulin, help your hormones reach normal levels, and help you lose weight. This information is not intended to replace advice given to you by your health care provider. Make sure you discuss any questions you have with your health care provider. Document Revised: 04/12/2020 Document Reviewed: 04/12/2020 Elsevier Patient Education  2021 [place] 49-38 Years Old, Female Preventive care refers to lifestyle choices and visits with your health care provider that can promote health and wellness. This includes:  A yearly physical exam. This is also called an annual wellness visit.  Regular dental and eye exams.  Immunizations.  Screening for certain conditions.  Healthy lifestyle choices, such as: ? Eating a healthy diet. ? Getting regular exercise. ? Not using drugs or products that contain nicotine and tobacco. ? Limiting alcohol use. What can I expect for my preventive care visit? Physical exam Your health care provider may check your:  Height and weight. These may be used to calculate your BMI (body mass index). BMI is a measurement that tells if you are at a healthy weight.  Heart rate and blood pressure.  Body temperature.  Skin for abnormal spots. Counseling  Your health care provider may ask you questions about your:  Past medical problems.  Family's medical history.  Alcohol, tobacco, and drug use.  Emotional well-being.  Home life and relationship well-being.  Sexual activity.  Diet, exercise, and sleep habits.  Work and work Statistician.  Access to firearms.  Method of birth control.  Menstrual cycle.  Pregnancy history. What immunizations do I  need? Vaccines are usually given at various ages, according to a schedule. Your health care provider will recommend vaccines for you based on your age, medical history, and lifestyle or other factors, such as travel or where you work.   What tests do I need? Blood tests  Lipid and cholesterol levels. These may be checked every 5 years starting at age 54.  Hepatitis C test.  Hepatitis B test. Screening  Diabetes screening. This is done by checking your blood sugar (glucose) after you have not eaten for a while (fasting).  STD (sexually transmitted disease) testing, if you are at risk.  BRCA-related cancer screening. This may be done if you have a family history of breast, ovarian, tubal, or peritoneal cancers.  Pelvic exam and Pap test. This may be done every 3 years starting at age 32. Starting at age 62, this may be done every 5 years if you have a Pap test in combination with an HPV test. Talk with your health care provider about your test results, treatment options, and if necessary, the need for more tests.   Follow these instructions at home: Eating and drinking  Eat a healthy diet that includes fresh fruits and vegetables, whole grains, lean protein, and low-fat dairy products.  Take vitamin and mineral supplements as recommended by your health care provider.  Do not drink alcohol if: ? Your health care provider tells you not to drink. ? You are pregnant, may be pregnant, or are planning to become pregnant.  If you drink alcohol: ? Limit how much you have to 0-1 drink a day. ? Be aware of how much alcohol is in your drink. In the [place] one drink equals one 12 oz bottle of beer (355 mL), one 5 oz glass of wine (148 mL), or one 1 oz glass of hard liquor (44 mL).   Lifestyle  Take daily care of your teeth and gums. Brush your teeth every morning and night with fluoride toothpaste. Floss one time each day.  Stay active. Exercise for at least 30 minutes 5 or more days each  week.  Do not use any products that contain nicotine or tobacco, such as cigarettes, e-cigarettes, and chewing tobacco. If you need help quitting, ask your health care provider.  Do not use drugs.  If you are sexually active, practice safe sex. Use a condom or other form of protection to prevent STIs (sexually transmitted infections).  If you do not wish to become pregnant, use a form of birth control. If you plan to become pregnant, see your health care provider for a prepregnancy visit.  Find healthy ways to cope with stress, such as: ? Meditation, yoga, or listening to music. ? Journaling. ? Talking to a trusted person. ? Spending time with friends and family. Safety  Always wear your seat belt while driving or riding in a vehicle.  Do not drive: ? If you have been drinking alcohol. Do not ride with someone who has been drinking. ? When you are tired or distracted. ? While texting.  Wear a helmet and other protective equipment during sports activities.  If you have firearms in your house, make sure you follow all gun safety procedures.  Seek help if you have been physically or sexually abused. What's next?  Go to your health care provider once a year for an annual wellness visit.  Ask your health care provider how often you should have your eyes and teeth checked.  Stay up to date on all vaccines. This information is not intended to replace advice given to you by your health care provider. Make sure you discuss any questions you have with your health care provider. Document Revised: 07/01/2020 Document Reviewed: 07/15/2018 Elsevier Patient Education  2021 [employer].

## 2021-04-01 NOTE — Addendum Note (Signed)
Addended by: Marchelle Folks on: 04/01/2021 04:12 PM   Modules accepted: Orders

## 2021-04-01 NOTE — Progress Notes (Signed)
I have seen, interviewed, and examined the patient in conjunction with the [facility] [employer] and affirm the diagnosis and management plan.   Gunnar Bulla, CNM [facility], [facility] 04/01/21 11:39 AM

## 2021-04-12 ENCOUNTER — Ambulatory Visit: Payer: 59 | Admitting: Certified Nurse Midwife

## 2021-04-12 ENCOUNTER — Encounter: Payer: Self-pay | Admitting: Certified Nurse Midwife

## 2021-04-12 ENCOUNTER — Other Ambulatory Visit: Payer: Self-pay

## 2021-04-12 VITALS — BP 108/70 | HR 88 | Resp 16 | Ht 63.0 in | Wt 170.1 lb

## 2021-04-12 DIAGNOSIS — Z8742 Personal history of other diseases of the female genital tract: Secondary | ICD-10-CM

## 2021-04-12 DIAGNOSIS — R5383 Other fatigue: Secondary | ICD-10-CM | POA: Diagnosis not present

## 2021-04-12 DIAGNOSIS — N921 Excessive and frequent menstruation with irregular cycle: Secondary | ICD-10-CM | POA: Diagnosis not present

## 2021-04-12 MED ORDER — IBUPROFEN 600 MG PO TABS: 600.0000 mg | ORAL_TABLET | Freq: Four times a day (QID) | ORAL | 0 refills | Status: DC | PRN

## 2021-04-12 NOTE — Progress Notes (Signed)
GYN ENCOUNTER NOTE  Subjective:       Lori Savage is a 35 y.o. G58P1001 female is here for gynecologic evaluation of the following issues:  1. Menorrhagia with irregular cycle with a history of PCOS  Last seen in office on 04/01/2021; for further details, please see previous noted.    Gynecologic History  Patient's last menstrual period was 03/26/2021 (exact date).   Contraception: none  Last Pap: 06/2019. Results were: Neg/Neg  Obstetric History  OB History  Gravida Para Term Preterm AB Living  1 1 1     1   SAB IAB Ectopic Multiple Live Births          1    # Outcome Date GA Lbr Len/2nd Weight Sex Delivery Anes PTL Lv  1 Term 03/06/09   6 lb 15 oz (3.147 kg) M CS-Unspec EPI N LIV     Complications: Failure to Progress in Second Stage, Fetal Intolerance    Past Medical History:  Diagnosis Date  . PCOS (polycystic ovarian syndrome)   . Situational depression     Past Surgical History:  Procedure Laterality Date  . CESAREAN SECTION      Current Outpatient Medications on File Prior to Visit  Medication Sig Dispense Refill  . cyclobenzaprine (FLEXERIL) 5 MG tablet Take 5 mg by mouth 3 (three) times daily as needed.    03/08/09 spironolactone (ALDACTONE) 100 MG tablet Take 100 mg by mouth 2 (two) times daily.     No current facility-administered medications on file prior to visit.    No Known Allergies  Social History   Socioeconomic History  . Marital status: Single    Spouse name: Not on file  . Number of children: Not on file  . Years of education: Not on file  . Highest education level: Not on file  Occupational History  . Not on file  Tobacco Use  . Smoking status: Never Smoker  . Smokeless tobacco: Never Used  Vaping Use  . Vaping Use: Never used  Substance and Sexual Activity  . Alcohol use: Yes    Comment: social  . Drug use: No  . Sexual activity: Yes  Other Topics Concern  . Not on file  Social History Narrative  . Not on file   Social  Determinants of Health   Financial Resource Strain: Not on file  Food Insecurity: Not on file  Transportation Needs: Not on file  Physical Activity: Not on file  Stress: Not on file  Social Connections: Not on file  Intimate Partner Violence: Not on file    Family History  Problem Relation Age of Onset  . Lupus Mother   . Epilepsy Mother   . Breast cancer Maternal Grandmother   . Diabetes Maternal Grandmother   . Ovarian cancer Neg Hx   . Colon cancer Neg Hx     The following portions of the patient's history were reviewed and updated as appropriate: allergies, current medications, past family history, past medical history, past social history, past surgical history and problem list.  Review of Systems  ROS negative except as noted above. Information obtained from patient.   Objective:   BP 108/70   Pulse 88   Resp 16   Ht 5\' 3"  (1.6 m)   Wt 170 lb 1.6 oz (77.2 kg)   LMP 03/26/2021 (Exact Date)   BMI 30.13 kg/m    CONSTITUTIONAL: Well-developed, well-nourished female in no acute distress.   PHYSICAL EXAM: Not indicated.   Assessment:  1. Menorrhagia with irregular cycle - PCOS Diagnostic Profile  2. History of PCOS - PCOS Diagnostic Profile  3. Fatigue, unspecified type - PCOS Diagnostic Profile   Plan:   Labs, see orders.   Two (2) samples of Nextstellis given.   Encouraged to keep appointment for ultrasound scheduled 05/13/2021.   Reviewed red flag symptoms and when to call.   RTC as previously scheduled.    Serafina Royals, CNM [facility], [facility]

## 2021-04-12 NOTE — Patient Instructions (Addendum)
Menorrhagia Menorrhagia is a form of abnormal uterine bleeding in which menstrual periods are heavy or last longer than normal. With menorrhagia, the periods may cause enough blood loss and cramping that a woman becomes unable to take part in her usual activities. What are the causes? Common causes of this condition include:  Polyps or fibroids. These are noncancerous growths in the uterus.  An imbalance of the hormones estrogen and progesterone.  Anovulation, which occurs when one of the ovaries does not release an egg during one or more months.  A problem with the thyroid gland (hypothyroidism).  Side effects of having an intrauterine device (IUD).  Side effects of some medicines, such as NSAIDs or blood thinners.  A bleeding disorder that stops the blood from clotting normally. In some cases, the cause of this condition is not known. What increases the risk? You are more likely to develop this condition if you have cancer of the uterus. What are the signs or symptoms? Symptoms of this condition include:  Routinely having to change your pad or tampon every 1-2 hours because it is soaked.  Needing to use pads and tampons at the same time because of heavy bleeding.  Needing to wake up to change your pads or tampons during the night.  Passing blood clots larger than 1 inch (2.5 cm) in size.  Having bleeding that lasts for more than 7 days.  Having symptoms of low iron levels (anemia), such as tiredness (fatigue) or shortness of breath. How is this diagnosed? This condition may be diagnosed based on:  A physical exam.  Your symptoms and menstrual history.  Tests, such as: ? Blood tests to check if you are pregnant or if you have hormonal changes, a bleeding or thyroid disorder, anemia, or other problems. ? Pap test to check for cancerous changes, infections, or inflammation. ? Endometrial biopsy. This test involves removing a tissue sample from the lining of the uterus  (endometrium) to be examined under a microscope. ? Pelvic ultrasound. This test uses sound waves to create images of your uterus, ovaries, and vagina. The images can show if you have fibroids or other growths. ? Hysteroscopy. For this test, a thin, flexible tube with a light on the end (hysteroscope) is used to look inside your uterus. How is this treated? Treatment may not be needed for this condition. If it is needed, the best treatment for you will depend on:  Whether you need to prevent pregnancy.  Your desire to have children in the future.  The cause and severity of your bleeding.  Your personal preference. Medicine Medicines are the first step in treatment. You may be treated with:  Hormonal birth control methods. These treatments reduce bleeding during your menstrual period. They include: ? Birth control pills. ? Skin patch. ? Vaginal ring. ? Shots (injections) that you get every 3 months. ? Hormonal IUD. ? Implants that go under the skin.  Medicines that thicken the blood and slow bleeding.  Medicines that reduce swelling, such as ibuprofen.  Medicines that contain an artificial (synthetic) hormone called progestin.  Medicines that make the ovaries stop working for a short time.  Iron supplements to treat anemia.   Surgery If medicines do not work, surgery may be done. Surgical options may include:  Dilation and curettage (D&C). In this procedure, your health care provider opens the lowest part of the uterus (cervix) and then scrapes or suctions tissue from the endometrium. This reduces menstrual bleeding.  Operative hysteroscopy. In this procedure,   a hysteroscope is used to view your uterus and help remove polyps that may be causing heavy periods.  Endometrial ablation. This is when various techniques are used to permanently destroy your entire endometrium. After endometrial ablation, most women have little or no menstrual flow. This procedure reduces your ability to  become pregnant.  Endometrial resection. In this procedure, an electrosurgical wire loop is used to remove the endometrium. This procedure reduces your ability to become pregnant.  Hysterectomy. This is surgical removal of your uterus. This is a permanent procedure that stops menstrual periods. Pregnancy is not possible after a hysterectomy. Follow these instructions at home: Medicines  Take over-the-counter and prescription medicines only as told by your health care provider. This includes iron pills.  Do not change or switch medicines without asking your health care provider.  Do not take aspirin or medicines that contain aspirin 1 week before or during your menstrual period. Aspirin may make bleeding worse. Managing constipation Your iron pills may cause constipation. If you are taking prescription iron supplements, you may need to take these actions to prevent or treat constipation:  Drink enough fluid to keep your urine pale yellow.  Take over-the-counter or prescription medicines.  Eat foods that are high in fiber, such as beans, whole grains, and fresh fruits and vegetables.  Limit foods that are high in fat and processed sugars, such as fried or sweet foods. General instructions  If you need to change your sanitary pad or tampon more than once every 2 hours, limit your activity until the bleeding stops.  Eat well-balanced meals, including foods that are high in iron. Foods that have a lot of iron include leafy green vegetables, meat, liver, eggs, and whole-grain breads and cereals.  Do not try to lose weight until the abnormal bleeding has stopped and your blood iron level is back to normal. If you need to lose weight, work with your health care provider to lose weight safely.  Keep all follow-up visits. This is important. Contact a health care provider if:  You soak through a pad or tampon every 1 or 2 hours, and this happens every time you have a period.  You need to use  pads and tampons at the same time because you are bleeding so much.  You have nausea, vomiting, diarrhea, or other problems related to medicines you are taking. Get help right away if:  You soak through more than a pad or tampon in 1 hour.  You pass clots bigger than 1 inch (2.5 cm) wide.  You feel short of breath.  You feel like your heart is beating too fast.  You feel dizzy or you faint.  You feel very weak or tired. Summary  Menorrhagia is a form of abnormal uterine bleeding in which menstrual periods are heavy or last longer than normal.  Treatment may not be needed for this condition. If it is needed, it may include medicines or procedures.  Take over-the-counter and prescription medicines only as told by your health care provider. This includes iron pills.  Get help right away if you have heavy bleeding that soaks through more than a pad or tampon in 1 hour, you pass large clots, or you feel dizzy, short of breath, or very weak or tired. This information is not intended to replace advice given to you by your health care provider. Make sure you discuss any questions you have with your health care provider. Document Revised: 07/17/2020 Document Reviewed: 07/17/2020 Elsevier Patient Education  2021   Elsevier Inc.  

## 2021-04-22 ENCOUNTER — Other Ambulatory Visit: Payer: 59

## 2021-04-23 LAB — PCOS DIAGNOSTIC PROFILE
17-Alpha-Hydroxyprogesterone: 48 ng/dL
ANTI-MULLERIAN HORMONE (AMH): 14.8 ng/mL — ABNORMAL HIGH
DHEA-Sulfate, LCMS: 93 ug/dL
Estradiol, Serum, MS: 42 pg/mL
Follicle Stimulating Hormone: 5.3 m[IU]/mL
Free Testosterone, Serum: 5 pg/mL
Luteinizing Hormone (LH) ECL: 14 m[IU]/mL
Prolactin: 8.73 ng/mL
Sex Hormone Binding Globulin: 25.1 nmol/L
TSH: 0.46 uU/mL
Testosterone, Serum (Total): 50 ng/dL
Testosterone-% Free: 1 %

## 2021-04-24 ENCOUNTER — Other Ambulatory Visit: Payer: 59

## 2021-05-13 ENCOUNTER — Other Ambulatory Visit: Payer: Self-pay

## 2021-05-13 ENCOUNTER — Ambulatory Visit
Admission: RE | Admit: 2021-05-13 | Discharge: 2021-05-13 | Disposition: A | Discharge status: Home / Self Care | Payer: 59 | Source: Ambulatory Visit | Attending: Certified Nurse Midwife | Admitting: Certified Nurse Midwife

## 2021-05-13 DIAGNOSIS — N921 Excessive and frequent menstruation with irregular cycle: Secondary | ICD-10-CM | POA: Insufficient documentation

## 2021-05-13 DIAGNOSIS — Z319 Encounter for procreative management, unspecified: Secondary | ICD-10-CM | POA: Diagnosis present

## 2021-05-13 DIAGNOSIS — Z8742 Personal history of other diseases of the female genital tract: Secondary | ICD-10-CM

## 2021-05-17 ENCOUNTER — Telehealth: Payer: Self-pay | Admitting: Certified Nurse Midwife

## 2021-05-17 DIAGNOSIS — Z3041 Encounter for surveillance of contraceptive pills: Secondary | ICD-10-CM

## 2021-05-17 DIAGNOSIS — N921 Excessive and frequent menstruation with irregular cycle: Secondary | ICD-10-CM

## 2021-05-17 DIAGNOSIS — Z8742 Personal history of other diseases of the female genital tract: Secondary | ICD-10-CM

## 2021-05-17 NOTE — Telephone Encounter (Signed)
Telephone call to number on file answered by [employer] assistant then telephone call disconnected.    Lori Savage, CNM [facility], [facility] 05/17/21 4:18 PM

## 2021-05-17 NOTE — Telephone Encounter (Signed)
Patient called stating that she has been spotting and cramping, she has already had her monthly cycle and is having concerns. Please Advice.

## 2021-05-17 NOTE — Telephone Encounter (Signed)
Telephone call received from patient, verified full name and date of birth.   Reports spotting and abdominal cramping all week, period ended on Tuesday.   Started OCP last month, new pack started this week.   Wearing one (1) pad every two (2) to three (3) hours; cramping resolving with Motrin.   Advised continue to pill per package instructions.   Reviewed red flag symptoms and when to call.   Keep appointment scheduled for Monday, 05/27/2021, as scheduled.    Lori Savage, CNM [facility], [facility] 05/17/21 4:26 PM

## 2021-05-24 ENCOUNTER — Other Ambulatory Visit: Payer: Self-pay

## 2021-05-24 ENCOUNTER — Ambulatory Visit: Payer: 59 | Admitting: Certified Nurse Midwife

## 2021-05-24 ENCOUNTER — Encounter: Payer: Self-pay | Admitting: Certified Nurse Midwife

## 2021-05-24 ENCOUNTER — Other Ambulatory Visit (HOSPITAL_COMMUNITY)
Admission: RE | Admit: 2021-05-24 | Discharge: 2021-05-24 | Disposition: A | Discharge status: Home / Self Care | Payer: 59 | Source: Ambulatory Visit | Attending: Certified Nurse Midwife | Admitting: Certified Nurse Midwife

## 2021-05-24 VITALS — BP 100/70 | HR 83 | Resp 16 | Ht 63.0 in | Wt 170.3 lb

## 2021-05-24 DIAGNOSIS — N921 Excessive and frequent menstruation with irregular cycle: Secondary | ICD-10-CM | POA: Diagnosis not present

## 2021-05-24 DIAGNOSIS — N898 Other specified noninflammatory disorders of vagina: Secondary | ICD-10-CM | POA: Diagnosis present

## 2021-05-24 NOTE — Patient Instructions (Signed)
Diet for Polycystic Ovary Syndrome °Polycystic ovary syndrome (PCOS) is a common hormonal disorder that affects a woman's reproductive system. It can cause problems with menstrual periods and make it hard to get and stay pregnant. Changing what you eat can help your hormones reach normal levels, improve your health, and help you better manage PCOS. °Following a balanced diet can help you lose weight and improve the way that your body uses the hormone insulin to control blood sugar. This may include: °Eating low-fat (lean) proteins, complex carbohydrates, fresh fruits and vegetables, low-fat dairy products, healthy fats, and fiber. °Cutting down on calories. °Exercising regularly. °What are tips for following this plan? °Follow a balanced diet for meals and snacks. Eat breakfast, lunch, dinner, and one or two snacks every day. °Include protein in each meal and snack. °Choose whole grains instead of products that are made with refined flour. °Eat a variety of foods. °Exercise regularly as told by your health care provider. Aim to do at least 30 minutes of exercise on most days of the week. °If you are overweight or obese: °Pay attention to how many calories you eat. Cutting down on calories can help you lose weight. °Work with your health care provider or a dietitian to figure out how many calories you need each day. °What foods should I eat? °Fruits °Include a variety of colors and types. All fruits are helpful for PCOS. °Vegetables °Include a variety of colors and types. All vegetables are helpful for PCOS. °Grains °Whole grains, such as whole wheat. Whole-grain breads, crackers, cereals, and pasta. Unsweetened oatmeal. Bulgur, barley, quinoa, and brown rice. Tortillas made from corn or whole-wheat flour. °Meats and other proteins °Lean proteins, such as fish, chicken, beans, eggs, and tofu. °Dairy °Low-fat dairy products, such as skim milk, cheese sticks, and yogurt. °Beverages °Low-fat or fat-free drinks, such as  water, low-fat milk, sugar-free drinks, and small amounts of 100% fruit juice. °Seasonings and condiments °Ketchup. Mustard. Barbecue sauce. Relish. Low-fat or fat-free mayonnaise. °Fats and oils °Olive oil or canola oil. Walnuts and almonds. °The items listed above may not be a complete list of recommended foods and beverages. Contact a dietitian for more options. °What foods should I avoid? °Foods that are high in calories or fat, especially saturated or trans fats. Fried foods. Sweets. Products that are made from refined white flour, including white bread, pastries, white rice, and pasta. °The items listed above may not be a complete list of foods and beverages to avoid. Contact a dietitian for more information. °Summary °PCOS is a hormonal imbalance that affects a woman's reproductive system. It can cause problems with menstrual periods and make it hard to get and stay pregnant. °You can help to manage your PCOS by exercising regularly and eating a healthy, varied diet of vegetables, fruit, whole grains, lean protein, and low-fat dairy products. °Changing what you eat can improve the way that your body uses insulin, help your hormones reach normal levels, and help you lose weight. °This information is not intended to replace advice given to you by your health care provider. Make sure you discuss any questions you have with your health care provider. °Document Revised: 04/12/2020 Document Reviewed: 04/12/2020 °Elsevier Patient Education © 2022 Elsevier Inc. ° °

## 2021-05-27 ENCOUNTER — Encounter: Payer: 59 | Admitting: Certified Nurse Midwife

## 2021-05-27 NOTE — Progress Notes (Signed)
GYN ENCOUNTER NOTE  Subjective:       Lori Savage is a 35 y.o. G71P1001 female is here for gynecologic evaluation of the following issues:  1. Spotting, vaginal odor and discharge  Taking OCP samples twice a day to stop bleeding, requests additional pack.   Denies difficulty breathing or respiratory distress, chest pain, abdominal pain, excessive vaginal bleeding, dysuria, and leg pain or swelling.    Gynecologic History  Patient's last menstrual period was 05/14/2021.  Contraception: none  Last Pap: 06/2019. Results were: Neg/Neg  Obstetric History  OB History  Gravida Para Term Preterm AB Living  1 1 1     1   SAB IAB Ectopic Multiple Live Births          1    # Outcome Date GA Lbr Len/2nd Weight Sex Delivery Anes PTL Lv  1 Term 03/06/09   6 lb 15 oz (3.147 kg) M CS-Unspec EPI N LIV     Complications: Failure to Progress in Second Stage, Fetal Intolerance    Past Medical History:  Diagnosis Date   PCOS (polycystic ovarian syndrome)    Situational depression     Past Surgical History:  Procedure Laterality Date   CESAREAN SECTION      Current Outpatient Medications on File Prior to Visit  Medication Sig Dispense Refill   cyclobenzaprine (FLEXERIL) 5 MG tablet Take 5 mg by mouth 3 (three) times daily as needed.     ibuprofen (ADVIL) 600 MG tablet Take 1 tablet (600 mg total) by mouth every 6 (six) hours as needed. 60 tablet 0   spironolactone (ALDACTONE) 100 MG tablet Take 100 mg by mouth 2 (two) times daily.     No current facility-administered medications on file prior to visit.    No Known Allergies  Social History   Socioeconomic History   Marital status: Single    Spouse name: Not on file   Number of children: Not on file   Years of education: Not on file   Highest education level: Not on file  Occupational History   Not on file  Tobacco Use   Smoking status: Never   Smokeless tobacco: Never  Vaping Use   Vaping Use: Never used  Substance  and Sexual Activity   Alcohol use: Yes    Comment: social   Drug use: No   Sexual activity: Yes  Other Topics Concern   Not on file  Social History Narrative   Not on file   Social Determinants of Health   Financial Resource Strain: Not on file  Food Insecurity: Not on file  Transportation Needs: Not on file  Physical Activity: Not on file  Stress: Not on file  Social Connections: Not on file  Intimate Partner Violence: Not on file    Family History  Problem Relation Age of Onset   Lupus Mother    Epilepsy Mother    Breast cancer Maternal Grandmother    Diabetes Maternal Grandmother    Ovarian cancer Neg Hx    Colon cancer Neg Hx     The following portions of the patient's history were reviewed and updated as appropriate: allergies, current medications, past family history, past medical history, past social history, past surgical history and problem list.  Review of Systems  ROS negative except as noted above. Information obtained from patient.   Objective:   BP 100/70   Pulse 83   Resp 16   Ht 5\' 3"  (1.6 m)   Wt 170 lb  4.8 oz (77.2 kg)   LMP 05/14/2021   BMI 30.17 kg/m   CONSTITUTIONAL: Well-developed, well-nourished female in no acute distress.   ABDOMEN: Soft, non distended; Non tender.  No Organomegaly.  PELVIC:  External Genitalia: Normal  Vagina: Normal, swab collected  Cervix: Normal  MUSCULOSKELETAL: Normal range of motion. No tenderness.  No cyanosis, clubbing, or edema.  Assessment:   1. Vaginal discharge  - Cervicovaginal ancillary only  2. Vaginal odor  - Cervicovaginal ancillary only  3. Breakthrough bleeding on OCPs      Plan:   Nextstellis samples given.   Normal ultrasound findings discussed with patient, verbalized understanding.   Vaginal swab collected, see orders.   Reviewed red flag symptoms and when to call.   RTC once cycle regulated to discuss ovulation induction options; Femara previously not covered by  insurance. Patient to check coverage options and then contact CNM.   Serafina Royals, CNM [facility], [facility]

## 2021-05-28 LAB — CERVICOVAGINAL ANCILLARY ONLY
Bacterial Vaginitis (gardnerella): POSITIVE — AB
Candida Glabrata: NEGATIVE
Candida Vaginitis: NEGATIVE
Comment: NEGATIVE
Comment: NEGATIVE
Comment: NEGATIVE

## 2021-05-29 MED ORDER — METRONIDAZOLE 0.75 % VA GEL: 1.0000 | Freq: Two times a day (BID) | VAGINAL | 0 refills | Status: DC

## 2021-06-10 ENCOUNTER — Encounter: Payer: 59 | Admitting: Certified Nurse Midwife

## 2021-06-12 ENCOUNTER — Encounter: Payer: Self-pay | Admitting: Obstetrics and Gynecology

## 2021-06-12 ENCOUNTER — Other Ambulatory Visit: Payer: Self-pay

## 2021-06-12 ENCOUNTER — Ambulatory Visit: Payer: 59 | Admitting: Obstetrics and Gynecology

## 2021-06-12 VITALS — BP 103/74 | HR 91 | Ht 63.0 in | Wt 171.9 lb

## 2021-06-12 DIAGNOSIS — Z8742 Personal history of other diseases of the female genital tract: Secondary | ICD-10-CM | POA: Diagnosis not present

## 2021-06-12 DIAGNOSIS — N760 Acute vaginitis: Secondary | ICD-10-CM | POA: Diagnosis not present

## 2021-06-12 DIAGNOSIS — B9689 Other specified bacterial agents as the cause of diseases classified elsewhere: Secondary | ICD-10-CM | POA: Diagnosis not present

## 2021-06-12 DIAGNOSIS — N921 Excessive and frequent menstruation with irregular cycle: Secondary | ICD-10-CM

## 2021-06-12 MED ORDER — SOLOSEC 2 G PO PACK: 1.0000 | PACK | Freq: Once | ORAL | 0 refills | Status: AC

## 2021-06-12 NOTE — Progress Notes (Signed)
Patient is a 35 y.o. G29P1001 female who presents for abnormal vaginal bleeding and passing out at work. Patient stated having heavy cycles lasting 20 plus day with clots,cold chills, passed out at work. Patient was given birth controls Micronor to control the bleeding. Patient started taking prenatal vitamins with iron and magnesium to help with her symptoms.   Shawn Route  LPN [facility]

## 2021-06-12 NOTE — Progress Notes (Signed)
GYNECOLOGY PROGRESS NOTE  Subjective:    Patient ID: Lori Savage, female    DOB: 1986/03/18, 35 y.o.   MRN: [id number]  HPI  Patient is a 35 y.o. G40P1001 female with a h/o PCOS who presents for complaints of dysfunctional uterine bleeding. She has bee previously seen by Serafina Royals, CNM.  She notes that she has a history of irregular cycles. Cycles are heavy when they do occur, last 7 days. She and her partner are currently desiring to conceive so she was making plans to to regulate her cycles.  She was started on Nextellis (given samples) to help regulate her cycles, however this has made her bleeding worse. She also has associated headaches and back cramps with her bleeding.  She notes an episode where she passed out at work. Ashya was seen in the ER [facility]) on 7/19 for her episode.  Noted that her iron was a little low, but overall not anemic.  Taking a PNV, and taking norethindrone (2nd week) as prescribed by the ER. Bleeding has slowed down but not completely stopped. Now having breast tenderness and back tenderness.  Additionally she reports experiencing vaginal odor - had diagnosis of BV several weeks ago, tried using Metrogel, but could not complete course due to heavy flow.     The following portions of the patient's history were reviewed and updated as appropriate:  She  has a past medical history of PCOS (polycystic ovarian syndrome) and Situational depression.  She  has a past surgical history that includes Cesarean section.  Her family history includes Breast cancer in her maternal grandmother; Diabetes in her maternal grandmother; Epilepsy in her mother; Lupus in her mother.  She  reports that she has never smoked. She has never used smokeless tobacco. She reports previous alcohol use. She reports that she does not use drugs.  Current Outpatient Medications on File Prior to Visit  Medication Sig Dispense Refill   cyclobenzaprine (FLEXERIL) 5 MG tablet Take 5 mg by  mouth 3 (three) times daily as needed.     ibuprofen (ADVIL) 600 MG tablet Take 1 tablet (600 mg total) by mouth every 6 (six) hours as needed. 60 tablet 0   norethindrone (MICRONOR) 0.35 MG tablet Take 1 tablet by mouth daily.     No current facility-administered medications on file prior to visit.   She has No Known Allergies..  Review of Systems Pertinent items noted in HPI and remainder of comprehensive ROS otherwise negative.   Objective:   Blood pressure 103/74, pulse 91, height 5\' 3"  (1.6 m), weight 171 lb 14.4 oz (78 kg), last menstrual period 05/14/2021. Body mass index is 30.45 kg/m. General appearance: alert and no distress Abdomen: soft, non-tender; bowel sounds normal; no masses,  no organomegaly Remainder of exam deferred today.    Labs:   Reviewed in Care Everywhere  Imaging:  05/16/2021 PELVIC COMPLETE WITH TRANSVAGINAL CLINICAL DATA:  History of PCOS with heavy menstrual cycles, initial encounter  EXAM: TRANSABDOMINAL AND TRANSVAGINAL ULTRASOUND OF PELVIS  TECHNIQUE: Both transabdominal and transvaginal ultrasound examinations of the pelvis were performed. Transabdominal technique was performed for global imaging of the pelvis including uterus, ovaries, adnexal regions, and pelvic cul-de-sac. It was necessary to proceed with endovaginal exam following the transabdominal exam to visualize the ovaries.  COMPARISON:  None  FINDINGS: Uterus  Measurements: 10.4 x 4.6 x 5.3 cm. = volume: 120 mL. No fibroids or other mass visualized.  Endometrium  Thickness: 3 mm.  No focal abnormality  visualized.  Right ovary  Measurements: 2.7 x 2.3 x 2.5 cm = volume: 7.7 mL. Normal appearance/no adnexal mass.  Left ovary  Measurements: 2.5 x 1.8 x 1.9 cm. = volume: 4.4 mL. Normal appearance/no adnexal mass.  Other findings  No abnormal free fluid.  IMPRESSION: Unremarkable pelvic ultrasound.  Electronically Signed   By: Alcide Clever M.D.   On: 05/14/2021  11:03  Assessment:   1. History of PCOS   2. Breakthrough bleeding on OCPs   3. Bacterial vaginosis     Plan:   Patient with h/o PCOS, desiring to conceive, however with heavy menses. Changed from Nextellis to Norethindrone in the ER, notes bleeding is slowly improving.  Advised to continue this for the next 3 months to regulate cycles. Once she completes course, will return for further management of cycles and fertility. Discussed possibility of requiring assistance with ovulation induction medications.  Bacterial vaginosis - patient unable to complete course of Metrogel due to bleeding.  Desires to try Solosec. Will prescribe.  Patient will f/u in September after completion of Norethindrone.     A total of 20 minutes were spent face-to-face with the patient during this encounter and over half of that time involved counseling and coordination of care.   Hildred Laser, MD [facility]

## 2021-06-14 ENCOUNTER — Other Ambulatory Visit: Payer: Self-pay

## 2021-06-14 MED ORDER — METRONIDAZOLE 500 MG PO TABS: 500.0000 mg | ORAL_TABLET | Freq: Two times a day (BID) | ORAL | 0 refills | Status: DC

## 2021-06-15 ENCOUNTER — Encounter: Payer: Self-pay | Admitting: Obstetrics and Gynecology

## 2021-06-15 NOTE — Patient Instructions (Signed)
Dysfunctional Uterine Bleeding Dysfunctional uterine bleeding is abnormal bleeding from the uterus. Dysfunctional uterine bleeding includes: A menstrual period that comes earlier or later than usual. A menstrual period that is lighter or heavier than usual, or has large blood clots. Vaginal bleeding between menstrual periods. Skipping one or more menstrual periods. Vaginal bleeding after sex. Vaginal bleeding after menopause. Follow these instructions at home: Eating and drinking  Eat well-balanced meals. Include foods that are high in iron, such as liver, meat, shellfish, green leafy vegetables, and eggs. To prevent or treat constipation, your health care provider may recommend that you: Drink enough fluid to keep your urine pale yellow. Take over-the-counter or prescription medicines. Eat foods that are high in fiber, such as beans, whole grains, and fresh fruits and vegetables. Limit foods that are high in fat and processed sugars, such as fried or sweet foods. Medicines Take over-the-counter and prescription medicines only as told by your health care provider. Do not change medicines without talking with your health care provider. Aspirin or medicines that contain aspirin may make the bleeding worse. Do not take those medicines: During the week before your menstrual period. During your menstrual period. If you were prescribed iron pills, take them as told by your health care provider. Iron pills help to replace iron that your body loses because of this condition. Activity If you need to change your sanitary pad or tampon more than one time every 2 hours: Lie in bed with your feet raised (elevated). Place a cold pack on your lower abdomen. Rest as much as possible until the bleeding stops or slows down. Do not try to lose weight until the bleeding has stopped and your blood iron level is back to normal. General instructions  For two months, write down: When your menstrual period  starts. When your menstrual period ends. When any abnormal vaginal bleeding occurs. What problems you notice. Keep all follow up visits as told by your health care provider. This is important. Contact a health care provider if you: Feel light-headed or weak. Have nausea and vomiting. Cannot eat or drink without vomiting. Feel dizzy or have diarrhea while you are taking medicines. Are taking birth control pills or hormones, and you want to change them or stop taking them. Get help right away if: You develop a fever or chills. You need to change your sanitary pad or tampon more than one time per hour. Your vaginal bleeding becomes heavier, or your flow contains clots more often. You develop pain in your abdomen. You lose consciousness. You develop a rash. Summary Dysfunctional uterine bleeding is abnormal bleeding from the uterus. It includes menstrual bleeding of abnormal duration, volume, or regularity. Bleeding after sex and after menopause are also considered dysfunctional uterine bleeding. This information is not intended to replace advice given to you by your health care provider. Make sure you discuss any questions you have with your health care provider. Document Revised: 04/14/2018 Document Reviewed: 04/14/2018 Elsevier Patient Education  2022 Elsevier Inc.  

## 2021-06-24 ENCOUNTER — Ambulatory Visit: Payer: Self-pay

## 2021-07-08 ENCOUNTER — Telehealth: Payer: Self-pay | Admitting: Obstetrics and Gynecology

## 2021-07-08 NOTE — Telephone Encounter (Signed)
Please see phone call encounters.  

## 2021-07-08 NOTE — Telephone Encounter (Signed)
fyi

## 2021-07-08 NOTE — Telephone Encounter (Signed)
Patient called and wanted to give an FYI-She cancelled her appointment for 8/23 and is currently in the ER and stated she fainted.

## 2021-07-08 NOTE — Telephone Encounter (Signed)
Patient has an appointment tomorrow. Please advise. Thanks [employer]

## 2021-07-08 NOTE — Telephone Encounter (Signed)
Spoke to pt concerning her call to the office. Pt stated that she was taking her OCP as prescribed but is still having extreme cramping and heavy bleeding. Pt stated that she in unable to get out the of the bed or go to work or do anything due to the heavy bleeding and cramping. Pt stated that she was having a lot of lower back pain, abd cramping and changing her pad every hour. Pt stated that she had towels in the bed due to the heavy bleeding. Pt was offered the 10am appointment for 07/09/2021. Pt requested for an earlier appointment because she wanted to go to her son's school tomorrow to do something with him. Pt was informed that was the only appointment available until next week. Pt stated that she would take the appointment.

## 2021-07-08 NOTE — Telephone Encounter (Signed)
Patient states that she had her cycle at the beginning of August (the 7th-12th) she stated that Friday (07/05/21) she started bleeding again and the flow is very heavy and cramps are severe.  She stated that she is taking her birth control like she is suppose to.  Patient stated that she can't get up out of the bed because she is bleeding so heavy.  Please advise

## 2021-07-09 ENCOUNTER — Encounter: Payer: Self-pay | Admitting: Obstetrics and Gynecology

## 2021-07-09 ENCOUNTER — Encounter: Payer: 59 | Admitting: Obstetrics and Gynecology

## 2021-07-09 NOTE — Telephone Encounter (Signed)
Which ER did she follow up at. I was going to try and review her chart but don't see anything at [facility] or in Care Everywhere.  Please check on patient and see how she is doing.

## 2021-07-10 ENCOUNTER — Ambulatory Visit: Payer: 59 | Admitting: Obstetrics and Gynecology

## 2021-07-10 ENCOUNTER — Telehealth: Payer: Self-pay | Admitting: Obstetrics and Gynecology

## 2021-07-10 ENCOUNTER — Encounter: Payer: Self-pay | Admitting: Obstetrics and Gynecology

## 2021-07-10 ENCOUNTER — Other Ambulatory Visit: Payer: Self-pay

## 2021-07-10 VITALS — BP 120/80 | HR 92 | Resp 16 | Ht 63.0 in | Wt 174.9 lb

## 2021-07-10 DIAGNOSIS — R102 Pelvic and perineal pain: Secondary | ICD-10-CM | POA: Diagnosis not present

## 2021-07-10 DIAGNOSIS — R55 Syncope and collapse: Secondary | ICD-10-CM

## 2021-07-10 DIAGNOSIS — N939 Abnormal uterine and vaginal bleeding, unspecified: Secondary | ICD-10-CM | POA: Diagnosis not present

## 2021-07-10 MED ORDER — NORETHINDRONE ACETATE 5 MG PO TABS: 5.0000 mg | ORAL_TABLET | Freq: Every day | ORAL | 2 refills | Status: DC

## 2021-07-10 NOTE — Telephone Encounter (Signed)
It looks like she went to the Urgent Care in [facility] care everywhere [facility].  Geraldo Pitter

## 2021-07-10 NOTE — Telephone Encounter (Signed)
Pt called no answer LM via Vm to call the office to see if she could come in at 1:30pm today.

## 2021-07-10 NOTE — Progress Notes (Signed)
    GYNECOLOGY PROGRESS NOTE  Subjective:    Patient ID: Lori Savage, female    DOB: [birth year], 35 y.o.   MRN: [id number]  HPI  Patient is a 35 y.o. G39P1001 female who presents for irregular bleeding. She has been having irregular cycles, severe cramping, and fainting due to blood loss. She fainted on Monday evening.  Patient reports that she was seen at a hospital in [place] [facility] while visiting her grandmother yesterday after experiencing another fainting episode.  Reports that they told her that they could not do anything and that she needed to follow-up with her gynecologist.  Paul Dykes also reports that they informed her that she was dehydrated however patient notes that she drinks plenty of fluids and so was not certain that she agreed with this diagnosis.  She is experienced 2 cycles this month. First cycle was 08/07-08/12.  The second cycle began on August 20, and has continued to date.  Patient is currently on Micronor for contraception and management of her bleeding (recently changed several months ago from [facility] as she had breakthrough bleeding with OCPs).  She is also complaining of breast tenderness, back pain, fatigue, and nausea.  She has been experiencing recently night sweats and cold chills, was recently tested for COVID and was negative.  She is taking magnesium-year-old and also using NSAIDs to help with her bleeding and her cramping however is only noting minimal improvement.  She also reports that prior to her cycles she had a full week of spotting.  The following portions of the patient's history were reviewed and updated as appropriate: allergies, current medications, past family history, past medical history, past social history, past surgical history, and problem list.  Review of Systems Pertinent items noted in HPI and remainder of comprehensive ROS otherwise negative.   Objective:  Blood pressure 120/80, pulse 92, resp. rate 16, height 5\' 3"  (1.6 m),  weight 174 lb 14.4 oz (79.3 kg), last menstrual period 07/10/2021.  Body mass index is 30.98 kg/m. General appearance: alert and cooperative Remainder of exam deferred  Assessment:   1. Abnormal uterine bleeding   2. Syncope, unspecified syncope type     Plan:   Persistent abnormal uterine bleeding despite several trials of OCPs both combined and progesterone only.  I discussed with patient that she can discontinue the birth control pills at this time.  Will prescribe Aygestin to take for 1 month.  Patient noted that she had planned on stopping birth control in September as she was planning on potentially trying to conceive. Syncope -likely possible secondary to blood loss.  Patient notes that she is currently taking iron and a prenatal vitamin.  I recommended that she take additional iron.  Given samples of fusion plus to take daily in addition to her multivitamin.  We will also request records from [place] to review.   A total of 15 minutes were spent face-to-face with the patient during this encounter and over half of that time dealt with counseling and coordination of care.  October, MD [facility]

## 2021-07-10 NOTE — Telephone Encounter (Signed)
Lori Savage had an appointment with DR. Cherry on 8/23.  She was not able to come to that appointment because she was in the ED on 8/22.  [name] states she keeps bleeding and having a lot of cramping and pain and passed out again as to why she went to the ED on 8/22.  [name] called back and asked to make an appointment with anyone that could see her immediately.  Dr. Logan Bores had first availability on 8/31.  [name] was ok with this but asked that I send a message to Dr. Valentino Saxon as well to let her know what's been going on and to see if Dr. Valentino Saxon can call something in to stop the bleeding.  [name] states the bleeding and cramping is continuous and that's why she keeps bleeding and she feels no one is doing anything to stop the bleeding.  Please advise.

## 2021-07-17 ENCOUNTER — Encounter: Payer: 59 | Admitting: Obstetrics and Gynecology

## 2021-09-02 ENCOUNTER — Other Ambulatory Visit: Payer: Self-pay | Admitting: Obstetrics and Gynecology

## 2021-09-02 ENCOUNTER — Telehealth: Payer: Self-pay | Admitting: Obstetrics and Gynecology

## 2021-09-02 NOTE — Telephone Encounter (Signed)
Patient called and stated that she stopped taking the Norethindrone on the day that was discussed.  She stated that a day and half later she started to bleed again.  Patient stated that she has an appt on 10/28.  She wanted to let you know that she had started back taking Norethindrone and will continue to take it until she comes in for her appt.

## 2021-09-02 NOTE — Telephone Encounter (Signed)
Please advise. Thanks Avionna Bower 

## 2021-09-12 NOTE — Progress Notes (Signed)
    GYNECOLOGY PROGRESS NOTE  Subjective:    Patient ID: Lori Savage, female    DOB: May 06, 1986, 35 y.o.   MRN: [id number]  HPI  Patient is a 35 y.o. G37P1001 female who presents for follow up for PCOS, bleeding and fertility. She continues to take the Aygestin to help with the abnormal bleeding. She stopped at the end of September. Started cycle October 1- October 8 and started bleeding again on October 17-24. She has been spotting almost everyday in October, spotting stopped on Tuesday.  Began doubling the dose of her Aygestin to help with her bleeding.  Overall has been bleeding since May.  Has a h/o irregular cycles.   She would like to have a baby, but she is having difficulty making a decision with all the bleeding and dryness from wearing pads. Did not have any issues conceiving with last pregnancy (notes she was 6 months pregnant before she was aware of the pregnancy).  The following portions of the patient's history were reviewed and updated as appropriate: allergies, current medications, past family history, past medical history, past social history, past surgical history, and problem list.  Review of Systems A comprehensive review of systems was negative except for: Endocrine: positive for fertility problems and excessive facial hair growth   Objective:   Blood pressure 112/81, pulse 93, resp. rate 16, height 5\' 3"  (1.6 m), weight 178 lb 3.2 oz (80.8 kg), last menstrual period 09/09/2021.  Body mass index is 31.57 kg/m. General appearance: alert, cooperative, and no distress Abdomen: soft, non-tender; bowel sounds normal; no masses,  no organomegaly  Assessment:   1. Abnormal uterine bleeding   2. History of PCOS   3. Excessive hair growth      Plan:   Abnormal uterine bleeding - Patient has been on combined OCPs, progesterone OCPs, and now Aygestin with no improvement in her bleeding.  Discussed at this point a trial of combined OCP taper vs surgical intervention with  D&C to attempt to regulate her bleeding and cycles. After discussion of options, patient ok to proceed with surgical management with D&C.  Risks and benefits of procedure discussed.  Will schedule for 09/30/2021. Pre-op done today.  Will also increase dosing of Aygestin until surgery to 10 mg.  History of PCOS - patient desires pregnancy, no previous issues with fertility. After D&C, will attempt to help regulate cycles, and may consider use of ovulation induction medications to achieve quicker fertility.  Excessive hair growth - patient notes she was considering electrolysis.  Also discussed options of medications for use of removing hair, including spironolactone or Vaniqa cream. Patient notes that she will consider.     A total of 30 minutes were spent face-to-face with the patient during the encounter with greater than 50% dealing with counseling and coordination of care.   10/02/2021, MD [facility]

## 2021-09-13 ENCOUNTER — Encounter: Payer: 59 | Admitting: Obstetrics and Gynecology

## 2021-09-13 ENCOUNTER — Encounter: Payer: Self-pay | Admitting: Obstetrics and Gynecology

## 2021-09-13 ENCOUNTER — Other Ambulatory Visit: Payer: Self-pay

## 2021-09-13 ENCOUNTER — Ambulatory Visit: Payer: 59 | Admitting: Obstetrics and Gynecology

## 2021-09-13 VITALS — BP 112/81 | HR 93 | Resp 16 | Ht 63.0 in | Wt 178.2 lb

## 2021-09-13 DIAGNOSIS — N939 Abnormal uterine and vaginal bleeding, unspecified: Secondary | ICD-10-CM | POA: Diagnosis not present

## 2021-09-13 DIAGNOSIS — Z8742 Personal history of other diseases of the female genital tract: Secondary | ICD-10-CM

## 2021-09-13 DIAGNOSIS — L689 Hypertrichosis, unspecified: Secondary | ICD-10-CM

## 2021-09-13 MED ORDER — NORETHINDRONE ACETATE 5 MG PO TABS: 10.0000 mg | ORAL_TABLET | Freq: Every day | ORAL | 0 refills | Status: DC

## 2021-09-13 NOTE — H&P (View-Only) (Signed)
GYNECOLOGY PREOPERATIVE HISTORY AND PHYSICAL   Subjective:  Lori Savage is a 35 y.o. G1P1001 here for surgical management of dysfunctional uterine bleeding, unresponsive to hormonal therapy. No significant preoperative concerns.  Proposed surgery: Dilation and Curettage   Pertinent Gynecological History: Menses:  irregular, dysfunctional.  Bleeding: dysfunctional uterine bleeding Contraception: none Last pap: normal Date: 07/14/2019   Past Medical History:  Diagnosis Date   PCOS (polycystic ovarian syndrome)    Situational depression     Past Surgical History:  Procedure Laterality Date   CESAREAN SECTION      OB History  Gravida Para Term Preterm AB Living  1 1 1     1   SAB IAB Ectopic Multiple Live Births          1    # Outcome Date GA Lbr Len/2nd Weight Sex Delivery Anes PTL Lv  1 Term 03/06/09   6 lb 15 oz (3.147 kg) M CS-Unspec EPI N LIV     Complications: Failure to Progress in Second Stage, Fetal Intolerance    Family History  Problem Relation Age of Onset   Lupus Mother    Epilepsy Mother    Breast cancer Maternal Grandmother    Diabetes Maternal Grandmother    Ovarian cancer Neg Hx    Colon cancer Neg Hx     Social History   Socioeconomic History   Marital status: Single    Spouse name: Not on file   Number of children: Not on file   Years of education: Not on file   Highest education level: Not on file  Occupational History   Not on file  Tobacco Use   Smoking status: Never   Smokeless tobacco: Never  Vaping Use   Vaping Use: Never used  Substance and Sexual Activity   Alcohol use: Not Currently    Comment: social   Drug use: No   Sexual activity: Yes    Birth control/protection: Pill, None  Other Topics Concern   Not on file  Social History Narrative   Not on file   Social Determinants of Health   Financial Resource Strain: Not on file  Food Insecurity: Not on file  Transportation Needs: Not on file  Physical Activity:  Not on file  Stress: Not on file  Social Connections: Not on file  Intimate Partner Violence: Not on file    No current outpatient medications on file prior to visit.   No current facility-administered medications on file prior to visit.    No Known Allergies    Review of Systems Constitutional: No recent fever/chills/sweats Respiratory: No recent cough/bronchitis Cardiovascular: No chest pain Gastrointestinal: No recent nausea/vomiting/diarrhea Genitourinary: No UTI symptoms Hematologic/lymphatic:No history of coagulopathy or recent blood thinner use    Objective:   Blood pressure 112/81, pulse 93, resp. rate 16, height 5\' 3"  (1.6 m), weight 178 lb 3.2 oz (80.8 kg), last menstrual period 09/09/2021. CONSTITUTIONAL: Well-developed, well-nourished female in no acute distress.  HENT:  Normocephalic, atraumatic, External right and left ear normal. Oropharynx is clear and moist EYES: Conjunctivae and EOM are normal. Pupils are equal, round, and reactive to light. No scleral icterus.  NECK: Normal range of motion, supple, no masses SKIN: Skin is warm and dry. No rash noted. Not diaphoretic. No erythema. No pallor. NEUROLOGIC: Alert and oriented to person, place, and time. Normal reflexes, muscle tone coordination. No cranial nerve deficit noted. PSYCHIATRIC: Normal mood and affect. Normal behavior. Normal judgment and thought content. CARDIOVASCULAR: Normal heart  rate noted, regular rhythm RESPIRATORY: Effort and breath sounds normal, no problems with respiration noted ABDOMEN: Soft, nontender, nondistended. PELVIC: Deferred MUSCULOSKELETAL: Normal range of motion. No edema and no tenderness. 2+ distal pulses.    Labs:  Lab Results  Component Value Date   WBC 5.7 06/01/2020   HGB 11.7 06/01/2020   HCT 36.6 06/01/2020   MCV 78 (L) 06/01/2020   PLT 276 06/01/2020     Imaging Studies: US PELVIC COMPLETE WITH TRANSVAGINAL CLINICAL DATA:  History of PCOS with heavy  menstrual cycles, initial encounter  EXAM: TRANSABDOMINAL AND TRANSVAGINAL ULTRASOUND OF PELVIS  TECHNIQUE: Both transabdominal and transvaginal ultrasound examinations of the pelvis were performed. Transabdominal technique was performed for global imaging of the pelvis including uterus, ovaries, adnexal regions, and pelvic cul-de-sac. It was necessary to proceed with endovaginal exam following the transabdominal exam to visualize the ovaries.  COMPARISON:  None  FINDINGS: Uterus  Measurements: 10.4 x 4.6 x 5.3 cm. = volume: 120 mL. No fibroids or other mass visualized.  Endometrium  Thickness: 3 mm.  No focal abnormality visualized.  Right ovary  Measurements: 2.7 x 2.3 x 2.5 cm = volume: 7.7 mL. Normal appearance/no adnexal mass.  Left ovary  Measurements: 2.5 x 1.8 x 1.9 cm. = volume: 4.4 mL. Normal appearance/no adnexal mass.  Other findings  No abnormal free fluid.  IMPRESSION: Unremarkable pelvic ultrasound.  Electronically Signed   By: Alcide Clever M.D.   On: 05/14/2021 11:03   Assessment:    1. Abnormal uterine bleeding   2. History of PCOS   3. Excessive hair growth    Plan:   Counseling: Procedure, risks, reasons, benefits and complications (including injury to bowel, bladder, major blood vessel, ureter, bleeding, possibility of transfusion, infection, or fistula formation) reviewed in detail. Likelihood of success in alleviating the patient's condition was discussed. Routine postoperative instructions will be reviewed with the patient and her family in detail after surgery.  The patient concurred with the proposed plan, giving informed written consent for the surgery.   Preop testing ordered. Instructions reviewed, including NPO after midnight.   Hildred Laser, MD [facility]

## 2021-09-13 NOTE — H&P (Signed)
GYNECOLOGY PREOPERATIVE HISTORY AND PHYSICAL   Subjective:  Lori Savage is a 35 y.o. G1P1001 here for surgical management of dysfunctional uterine bleeding, unresponsive to hormonal therapy. No significant preoperative concerns.  Proposed surgery: Dilation and Curettage   Pertinent Gynecological History: Menses:  irregular, dysfunctional.  Bleeding: dysfunctional uterine bleeding Contraception: none Last pap: normal Date: 07/14/2019   Past Medical History:  Diagnosis Date   PCOS (polycystic ovarian syndrome)    Situational depression     Past Surgical History:  Procedure Laterality Date   CESAREAN SECTION      OB History  Gravida Para Term Preterm AB Living  1 1 1     1   SAB IAB Ectopic Multiple Live Births          1    # Outcome Date GA Lbr Len/2nd Weight Sex Delivery Anes PTL Lv  1 Term 03/06/09   6 lb 15 oz (3.147 kg) M CS-Unspec EPI N LIV     Complications: Failure to Progress in Second Stage, Fetal Intolerance    Family History  Problem Relation Age of Onset   Lupus Mother    Epilepsy Mother    Breast cancer Maternal Grandmother    Diabetes Maternal Grandmother    Ovarian cancer Neg Hx    Colon cancer Neg Hx     Social History   Socioeconomic History   Marital status: Single    Spouse name: Not on file   Number of children: Not on file   Years of education: Not on file   Highest education level: Not on file  Occupational History   Not on file  Tobacco Use   Smoking status: Never   Smokeless tobacco: Never  Vaping Use   Vaping Use: Never used  Substance and Sexual Activity   Alcohol use: Not Currently    Comment: social   Drug use: No   Sexual activity: Yes    Birth control/protection: Pill, None  Other Topics Concern   Not on file  Social History Narrative   Not on file   Social Determinants of Health   Financial Resource Strain: Not on file  Food Insecurity: Not on file  Transportation Needs: Not on file  Physical Activity:  Not on file  Stress: Not on file  Social Connections: Not on file  Intimate Partner Violence: Not on file    No current outpatient medications on file prior to visit.   No current facility-administered medications on file prior to visit.    No Known Allergies    Review of Systems Constitutional: No recent fever/chills/sweats Respiratory: No recent cough/bronchitis Cardiovascular: No chest pain Gastrointestinal: No recent nausea/vomiting/diarrhea Genitourinary: No UTI symptoms Hematologic/lymphatic:No history of coagulopathy or recent blood thinner use    Objective:   Blood pressure 112/81, pulse 93, resp. rate 16, height 5\' 3"  (1.6 m), weight 178 lb 3.2 oz (80.8 kg), last menstrual period 09/09/2021. CONSTITUTIONAL: Well-developed, well-nourished female in no acute distress.  HENT:  Normocephalic, atraumatic, External right and left ear normal. Oropharynx is clear and moist EYES: Conjunctivae and EOM are normal. Pupils are equal, round, and reactive to light. No scleral icterus.  NECK: Normal range of motion, supple, no masses SKIN: Skin is warm and dry. No rash noted. Not diaphoretic. No erythema. No pallor. NEUROLOGIC: Alert and oriented to person, place, and time. Normal reflexes, muscle tone coordination. No cranial nerve deficit noted. PSYCHIATRIC: Normal mood and affect. Normal behavior. Normal judgment and thought content. CARDIOVASCULAR: Normal heart  rate noted, regular rhythm RESPIRATORY: Effort and breath sounds normal, no problems with respiration noted ABDOMEN: Soft, nontender, nondistended. PELVIC: Deferred MUSCULOSKELETAL: Normal range of motion. No edema and no tenderness. 2+ distal pulses.    Labs:  Lab Results  Component Value Date   WBC 5.7 06/01/2020   HGB 11.7 06/01/2020   HCT 36.6 06/01/2020   MCV 78 (L) 06/01/2020   PLT 276 06/01/2020     Imaging Studies: US PELVIC COMPLETE WITH TRANSVAGINAL CLINICAL DATA:  History of PCOS with heavy  menstrual cycles, initial encounter  EXAM: TRANSABDOMINAL AND TRANSVAGINAL ULTRASOUND OF PELVIS  TECHNIQUE: Both transabdominal and transvaginal ultrasound examinations of the pelvis were performed. Transabdominal technique was performed for global imaging of the pelvis including uterus, ovaries, adnexal regions, and pelvic cul-de-sac. It was necessary to proceed with endovaginal exam following the transabdominal exam to visualize the ovaries.  COMPARISON:  None  FINDINGS: Uterus  Measurements: 10.4 x 4.6 x 5.3 cm. = volume: 120 mL. No fibroids or other mass visualized.  Endometrium  Thickness: 3 mm.  No focal abnormality visualized.  Right ovary  Measurements: 2.7 x 2.3 x 2.5 cm = volume: 7.7 mL. Normal appearance/no adnexal mass.  Left ovary  Measurements: 2.5 x 1.8 x 1.9 cm. = volume: 4.4 mL. Normal appearance/no adnexal mass.  Other findings  No abnormal free fluid.  IMPRESSION: Unremarkable pelvic ultrasound.  Electronically Signed   By: Alcide Clever M.D.   On: 05/14/2021 11:03   Assessment:    1. Abnormal uterine bleeding   2. History of PCOS   3. Excessive hair growth    Plan:   Counseling: Procedure, risks, reasons, benefits and complications (including injury to bowel, bladder, major blood vessel, ureter, bleeding, possibility of transfusion, infection, or fistula formation) reviewed in detail. Likelihood of success in alleviating the patient's condition was discussed. Routine postoperative instructions will be reviewed with the patient and her family in detail after surgery.  The patient concurred with the proposed plan, giving informed written consent for the surgery.   Preop testing ordered. Instructions reviewed, including NPO after midnight.   Hildred Laser, MD [facility]

## 2021-09-13 NOTE — Patient Instructions (Addendum)
GYNECOLOGY PRE-OPERATIVE INSTRUCTIONS  You are scheduled for surgery on 09/30/2021.  The name of your procedure is: Dilation and Curettage.   Please read through these instructions carefully regarding preparation for your surgery: Nothing to eat after midnight on the day prior to surgery.  Do not take any medications unless recommended by your provider on day prior to surgery.  Do not take NSAIDs (Motrin, Aleve) or aspirin 7 days prior to surgery.  You may take Tylenol products for minor aches and pains.  You will receive a prescription for pain medications post-operatively.  You will be contacted by phone approximately 1-2 weeks prior to surgery to schedule your pre-operative appointment.  If you are being admitted to the hospital for an overnight stay, you will require COVID testing prior to surgery. Instructions will be given to you on when and where to have this performed.  Please call the office if you have any questions regarding your upcoming surgery.    Thank you for choosing [facility].    Dilation and Curettage or Vacuum Curettage Dilation and curettage (D&C) and vacuum curettage are minor procedures. A D&C involves stretching the cervix (dilation) and scraping the inside lining of the uterus, or endometrium, with surgical instruments (curettage). During a D&C, tissue is gently scraped starting from the top of the uterus down to the lowest part of the uterus. During a vacuum curettage, the lining and tissue in the uterus are removed using gentle suction. Curettage may be performed to either diagnose or treat a problem. A diagnostic curettage may be done if you have: Irregular bleeding or clotting from the uterus. Spotting between menstrual periods, prolonged menstrual periods, or other abnormal bleeding. Bleeding after menopause. No menstrual period (amenorrhea). A change in the size and shape of the uterus. Abnormal endometrial cells discovered during a Pap test. For  treatment, curettage may be done: To remove an IUD (intrauterine device). To remove the remaining placenta after giving birth. During an abortion or after a miscarriage. To remove growths in the lining of the uterus. To remove certain rare types of noncancerous lumps (fibroids). Tell a health care provider about: Any allergies you have, including allergies to prescribed medicine or latex. All medicines you are taking, including vitamins, herbs, eye drops, creams, and over-the-counter medicines. Any problems you or family members have had with anesthetic medicines. Any blood disorders you have. Any surgeries you have had. Your medical history and any medical conditions you have. Whether you are pregnant or may be pregnant. Recent vaginal infections you have had. Recent menstrual periods, bleeding problems you have had, and what form of birth control (contraception) you use. What are the risks? Generally, this is a safe procedure. However, problems may occur, including: Infection. Heavy vaginal bleeding. Allergic reactions to medicines. Damage to the cervix or nearby structures or organs. Scar tissue developing inside the uterus. This can cause abnormal periods and may make it harder to get pregnant. A hole (perforation) in the wall of the uterus. This is rare. What happens before the procedure? Staying hydrated Follow instructions from your health care provider about hydration, which may include: Up to 2 hours before the procedure - you may continue to drink clear liquids, such as water, clear fruit juice, black coffee, and plain tea.  Eating and drinking restrictions Follow instructions from your health care provider about eating and drinking, which may include: 8 hours before the procedure - stop eating heavy meals or foods, such as meat, fried foods, or fatty foods. 6  hours before the procedure - stop eating light meals or foods, such as toast or cereal. 6 hours before the  procedure - stop drinking milk or drinks that contain milk. 2 hours before the procedure - stop drinking clear liquids. If your health care provider told you to take your medicine on the day of your procedure, take them with only a sip of water. Medicines Ask your health care provider about: Changing or stopping your regular medicines. This is especially important if you are taking diabetes medicines or blood thinners. Taking medicines such as aspirin and ibuprofen. These medicines can thin your blood. Do not take these medicines unless your health care provider tells you to take them. Taking over-the-counter medicines, vitamins, herbs, and supplements. You may be given a medicine to soften the cervix. This will help with dilation. Tests You may be given a pregnancy test on the day of the procedure. You may have a blood or urine sample taken. General instructions Do not use any products that contain nicotine or tobacco for at least 4 weeks before the procedure. These products include cigarettes, chewing tobacco, and vaping devices, such as e-cigarettes. If you need help quitting, ask your health care provider. For 24 hours before your procedure: Do not douche, use tampons, or have sex. Do not use medicines, creams, or suppositories in the vagina. Ask your health care provider what steps will be taken to help prevent infection. These may include: Removing hair at the procedure site. Washing skin with a germ-killing soap. Taking antibiotic medicine. Plan to have a responsible adult take you home from the hospital or clinic. If you will be going home right after the procedure, plan to have a responsible adult care for you for the time you are told. This is important. What happens during the procedure?  An IV will be inserted into one of your veins. You will be given one of the following: A medicine that numbs the area in and around the cervix (local anesthetic). A medicine to make you fall  asleep (general anesthetic). You will lie down on your back, with your feet in foot rests (stirrups). The size and position of your uterus will be checked. A lubricated instrument (speculum or Sims retractor) will be inserted into your vagina to widen its walls. This will allow your health care provider to see your cervix. Your cervix will be softened and dilated. This may be done by: Taking medicine by mouth or vaginally. Having thin rods or gradual widening instruments inserted into your cervix. A small, sharp, curved instrument (curette) will be used to scrape a small amount of tissue or cells from the endometrium or cervical canal. In some cases, gentle suction is applied with the curette. The cells will be taken to a lab for testing. The procedure may vary among health care providers and hospitals. What happens after the procedure? Your blood pressure, heart rate, breathing rate, and blood oxygen level will be monitored until you leave the hospital or clinic. You may have mild cramping, a backache, pain, and light bleeding or spotting. You may pass small blood clots from your vagina. You may have to wear compression stockings. These stockings help to prevent blood clots and reduce swelling in your legs. It is up to you to get the results of your procedure. Ask your health care provider, or the department that is doing the procedure, when your results will be ready. Summary Dilation and curettage (D&C) involves stretching (dilating) the cervix and scraping the inside  lining of the uterus with surgical instruments (curettage). Follow your health care provider's instructions about when to stop eating and drinking before the procedure, and whether to stop or change any medicines. After the procedure, you may have mild cramping, a backache, pain, and light bleeding or spotting. You may pass small blood clots from your vagina. Plan to have a responsible adult take you home from the hospital or  clinic. This information is not intended to replace advice given to you by your health care provider. Make sure you discuss any questions you have with your health care provider. Document Revised: 10/24/2020 Document Reviewed: 10/24/2020 Elsevier Patient Education  2022 [employer].

## 2021-09-26 ENCOUNTER — Encounter
Admission: RE | Admit: 2021-09-26 | Discharge: 2021-09-26 | Disposition: A | Discharge status: Home / Self Care | Payer: 59 | Source: Ambulatory Visit | Attending: Obstetrics and Gynecology | Admitting: Obstetrics and Gynecology

## 2021-09-26 ENCOUNTER — Other Ambulatory Visit: Payer: Self-pay

## 2021-09-26 HISTORY — DX: Anxiety disorder, unspecified: F41.9

## 2021-09-26 HISTORY — DX: Hypothyroidism, unspecified: E03.9

## 2021-09-26 NOTE — Patient Instructions (Signed)
Your procedure is scheduled on:09-30-21 Monday Report to the Registration Desk on the 1st floor of the [facility].Then proceed to the 2nd floor Surgery Desk in the Medical [facility] To find out your arrival time, please call [phone] between 1PM - 3PM on:09-27-21 Friday  REMEMBER: Instructions that are not followed completely may result in serious medical risk, up to and including death; or upon the discretion of your surgeon and anesthesiologist your surgery may need to be rescheduled.  Do not eat food after midnight the night before surgery.  No gum chewing, lozengers or hard candies.  You may however, drink CLEAR liquids up to 2 hours before you are scheduled to arrive for your surgery. Do not drink anything within 2 hours of your scheduled arrival time.  Clear liquids include: - water  - apple juice without pulp - gatorade (not RED, PURPLE, OR BLUE) - black coffee or tea (Do NOT add milk or creamers to the coffee or tea) Do NOT drink anything that is not on this list.  TAKE THESE MEDICATIONS THE MORNING OF SURGERY WITH A SIP OF WATER: -chlorpheniramine (CHLOR-TRIMETON) 4 MG tablet  One week prior to surgery: Stop Anti-inflammatories (NSAIDS) such as Advil, Aleve, Ibuprofen, Motrin, Naproxen, Naprosyn and Aspirin based products such as Excedrin, Goodys Powder, BC Powder.You may however, take Tylenol if needed for pain up until the day of surgery.  Stop ANY OVER THE COUNTER supplements/vitamins NOW (09-26-21) until after surgery (Biotin 1000 MCG CHEW, Magnesium 250 MG TABS)  No Alcohol for 24 hours before or after surgery.  No Smoking including e-cigarettes for 24 hours prior to surgery.  No chewable tobacco products for at least 6 hours prior to surgery.  No nicotine patches on the day of surgery.  Do not use any "recreational" drugs for at least a week prior to your surgery.  Please be advised that the combination of cocaine and anesthesia may have negative outcomes, up to  and including death. If you test positive for cocaine, your surgery will be cancelled.  On the morning of surgery brush your teeth with toothpaste and water, you may rinse your mouth with mouthwash if you wish. Do not swallow any toothpaste or mouthwash.  Do not wear jewelry, make-up, hairpins, clips or nail polish.  Do not wear lotions, powders, or perfumes.   Do not shave body from the neck down 48 hours prior to surgery just in case you cut yourself which could leave a site for infection.  Also, freshly shaved skin may become irritated if using the CHG soap.  Contact lenses, hearing aids and dentures may not be worn into surgery.  Do not bring valuables to the hospital. [facility] is not responsible for any missing/lost belongings or valuables.  Notify your doctor if there is any change in your medical condition (cold, fever, infection).  Wear comfortable clothing (specific to your surgery type) to the hospital.  After surgery, you can help prevent lung complications by doing breathing exercises.  Take deep breaths and cough every 1-2 hours. Your doctor may order a device called an Incentive Spirometer to help you take deep breaths. When coughing or sneezing, hold a pillow firmly against your incision with both hands. This is called "splinting." Doing this helps protect your incision. It also decreases belly discomfort.  If you are being admitted to the hospital overnight, leave your suitcase in the car. After surgery it may be brought to your room.  If you are being discharged the day of surgery,  you will not be allowed to drive home. You will need a responsible adult (18 years or older) to drive you home and stay with you that night.   If you are taking public transportation, you will need to have a responsible adult (18 years or older) with you. Please confirm with your physician that it is acceptable to use public transportation.   Please call the [facility]  Dept. at [phone] if you have any questions about these instructions.  Surgery Visitation Policy:  Patients undergoing a surgery or procedure may have one family member or support person with them as long as that person is not COVID-19 positive or experiencing its symptoms.  That person may remain in the waiting area during the procedure and may rotate out with other people.  Inpatient Visitation:    Visiting hours are 7 a.m. to 8 p.m. Up to two visitors ages 16+ are allowed at one time in a patient room. The visitors may rotate out with other people during the day. Visitors must check out when they leave, or other visitors will not be allowed. One designated support person may remain overnight. The visitor must pass COVID-19 screenings, use hand sanitizer when entering and exiting the patient's room and wear a mask at all times, including in the patient's room. Patients must also wear a mask when staff or their visitor are in the room. Masking is required regardless of vaccination status.

## 2021-09-30 ENCOUNTER — Encounter
Admission: RE | Disposition: A | Discharge status: Home / Self Care | Payer: Self-pay | Source: Home / Self Care | Attending: Obstetrics and Gynecology

## 2021-09-30 ENCOUNTER — Ambulatory Visit
Admission: RE | Admit: 2021-09-30 | Discharge: 2021-09-30 | Disposition: A | Discharge status: Home / Self Care | Payer: 59 | Attending: Obstetrics and Gynecology | Admitting: Obstetrics and Gynecology

## 2021-09-30 ENCOUNTER — Other Ambulatory Visit: Payer: Self-pay

## 2021-09-30 ENCOUNTER — Ambulatory Visit: Payer: 59 | Admitting: Anesthesiology

## 2021-09-30 ENCOUNTER — Encounter: Payer: Self-pay | Admitting: Obstetrics and Gynecology

## 2021-09-30 DIAGNOSIS — N939 Abnormal uterine and vaginal bleeding, unspecified: Secondary | ICD-10-CM

## 2021-09-30 DIAGNOSIS — N938 Other specified abnormal uterine and vaginal bleeding: Secondary | ICD-10-CM | POA: Diagnosis present

## 2021-09-30 HISTORY — PX: DILATION AND CURETTAGE OF UTERUS: SHX78

## 2021-09-30 LAB — CBC
HCT: 36.7 % (ref 36.0–46.0)
Hemoglobin: 12 g/dL (ref 12.0–15.0)
MCH: 24 pg — ABNORMAL LOW (ref 26.0–34.0)
MCHC: 32.7 g/dL (ref 30.0–36.0)
MCV: 73.3 fL — ABNORMAL LOW (ref 80.0–100.0)
Platelets: 406 10*3/uL — ABNORMAL HIGH (ref 150–400)
RBC: 5.01 MIL/uL (ref 3.87–5.11)
RDW: 14.7 % (ref 11.5–15.5)
WBC: 7.3 10*3/uL (ref 4.0–10.5)
nRBC: 0 % (ref 0.0–0.2)

## 2021-09-30 LAB — POCT PREGNANCY, URINE: Preg Test, Ur: NEGATIVE

## 2021-09-30 SURGERY — DILATION AND CURETTAGE
Anesthesia: General

## 2021-09-30 MED ORDER — ACETAMINOPHEN 500 MG PO TABS: 1000.0000 mg | ORAL_TABLET | Freq: Four times a day (QID) | ORAL | 0 refills | Status: DC | PRN

## 2021-09-30 MED ORDER — PROPOFOL 10 MG/ML IV BOLUS
INTRAVENOUS | Status: AC
  Filled 2021-09-30: qty 20

## 2021-09-30 MED ORDER — CHLORHEXIDINE GLUCONATE 0.12 % MT SOLN: 15.0000 mL | Freq: Once | OROMUCOSAL | Status: AC

## 2021-09-30 MED ORDER — GLYCOPYRROLATE 0.2 MG/ML IJ SOLN
INTRAMUSCULAR | Status: AC
  Filled 2021-09-30: qty 1

## 2021-09-30 MED ORDER — IBUPROFEN 600 MG PO TABS: 600.0000 mg | ORAL_TABLET | Freq: Four times a day (QID) | ORAL | 0 refills | Status: DC | PRN

## 2021-09-30 MED ORDER — CHLORHEXIDINE GLUCONATE 0.12 % MT SOLN
OROMUCOSAL | Status: AC
  Administered 2021-09-30: 15 mL via OROMUCOSAL
  Filled 2021-09-30: qty 15

## 2021-09-30 MED ORDER — DEXAMETHASONE SODIUM PHOSPHATE 10 MG/ML IJ SOLN
INTRAMUSCULAR | Status: AC
  Filled 2021-09-30: qty 1

## 2021-09-30 MED ORDER — KETOROLAC TROMETHAMINE 30 MG/ML IJ SOLN
INTRAMUSCULAR | Status: AC
  Filled 2021-09-30: qty 1

## 2021-09-30 MED ORDER — GLYCOPYRROLATE 0.2 MG/ML IJ SOLN
INTRAMUSCULAR | Status: DC | PRN
Start: 2021-09-30 — End: 2021-09-30
  Administered 2021-09-30: .2 mg via INTRAVENOUS

## 2021-09-30 MED ORDER — FENTANYL CITRATE (PF) 100 MCG/2ML IJ SOLN: 25.0000 ug | INTRAMUSCULAR | Status: DC | PRN

## 2021-09-30 MED ORDER — ONDANSETRON HCL 4 MG/2ML IJ SOLN
INTRAMUSCULAR | Status: DC | PRN
  Administered 2021-09-30: 4 mg via INTRAVENOUS

## 2021-09-30 MED ORDER — FAMOTIDINE 20 MG PO TABS
ORAL_TABLET | ORAL | Status: AC
  Administered 2021-09-30: 20 mg via ORAL
  Filled 2021-09-30: qty 1

## 2021-09-30 MED ORDER — 0.9 % SODIUM CHLORIDE (POUR BTL) OPTIME
TOPICAL | Status: DC | PRN
  Administered 2021-09-30: 100 mL

## 2021-09-30 MED ORDER — MIDAZOLAM HCL 5 MG/5ML IJ SOLN
INTRAMUSCULAR | Status: DC | PRN
  Administered 2021-09-30: 2 mg via INTRAVENOUS

## 2021-09-30 MED ORDER — FENTANYL CITRATE (PF) 100 MCG/2ML IJ SOLN
INTRAMUSCULAR | Status: AC
  Filled 2021-09-30: qty 2

## 2021-09-30 MED ORDER — DEXAMETHASONE SODIUM PHOSPHATE 10 MG/ML IJ SOLN
INTRAMUSCULAR | Status: DC | PRN
  Administered 2021-09-30: 10 mg via INTRAVENOUS

## 2021-09-30 MED ORDER — ORAL CARE MOUTH RINSE: 15.0000 mL | Freq: Once | OROMUCOSAL | Status: AC

## 2021-09-30 MED ORDER — LIDOCAINE HCL (PF) 2 % IJ SOLN
INTRAMUSCULAR | Status: AC
  Filled 2021-09-30: qty 5

## 2021-09-30 MED ORDER — LACTATED RINGERS IV SOLN: INTRAVENOUS | Status: DC

## 2021-09-30 MED ORDER — FENTANYL CITRATE (PF) 100 MCG/2ML IJ SOLN
INTRAMUSCULAR | Status: DC | PRN
  Administered 2021-09-30: 50 ug via INTRAVENOUS
  Administered 2021-09-30: 25 ug via INTRAVENOUS

## 2021-09-30 MED ORDER — MEPERIDINE HCL 25 MG/ML IJ SOLN: 6.2500 mg | INTRAMUSCULAR | Status: DC | PRN

## 2021-09-30 MED ORDER — ONDANSETRON HCL 4 MG/2ML IJ SOLN
INTRAMUSCULAR | Status: AC
  Filled 2021-09-30: qty 2

## 2021-09-30 MED ORDER — PROPOFOL 10 MG/ML IV BOLUS
INTRAVENOUS | Status: DC | PRN
  Administered 2021-09-30: 200 mg via INTRAVENOUS

## 2021-09-30 MED ORDER — ONDANSETRON HCL 4 MG/2ML IJ SOLN: 4.0000 mg | Freq: Once | INTRAMUSCULAR | Status: DC | PRN

## 2021-09-30 MED ORDER — LIDOCAINE HCL (PF) 2 % IJ SOLN
INTRAMUSCULAR | Status: DC | PRN
  Administered 2021-09-30: 50 mg

## 2021-09-30 MED ORDER — FAMOTIDINE 20 MG PO TABS: 20.0000 mg | ORAL_TABLET | Freq: Once | ORAL | Status: AC

## 2021-09-30 MED ORDER — LIDOCAINE HCL (PF) 1 % IJ SOLN
INTRAMUSCULAR | Status: AC
  Filled 2021-09-30: qty 30

## 2021-09-30 MED ORDER — MIDAZOLAM HCL 2 MG/2ML IJ SOLN
INTRAMUSCULAR | Status: AC
  Filled 2021-09-30: qty 2

## 2021-09-30 SURGICAL SUPPLY — 16 items
DRSG TELFA 3X8 NADH (GAUZE/BANDAGES/DRESSINGS) ×4 IMPLANT
GAUZE 4X4 16PLY ~~LOC~~+RFID DBL (SPONGE) ×4 IMPLANT
GLOVE SURG ENC MOIS LTX SZ6.5 (GLOVE) ×2 IMPLANT
GLOVE SURG UNDER LTX SZ7 (GLOVE) ×2 IMPLANT
GOWN STRL REUS W/ TWL LRG LVL3 (GOWN DISPOSABLE) ×2 IMPLANT
GOWN STRL REUS W/TWL LRG LVL3 (GOWN DISPOSABLE) ×2
KIT TURNOVER CYSTO (KITS) ×2 IMPLANT
NEEDLE HYPO 25X1 1.5 SAFETY (NEEDLE) ×2 IMPLANT
NEEDLE SPNL 25GX3.5 QUINCKE BL (NEEDLE) ×2 IMPLANT
NS IRRIG 500ML POUR BTL (IV SOLUTION) ×2 IMPLANT
PACK DNC HYST (MISCELLANEOUS) ×2 IMPLANT
PAD OB MATERNITY 4.3X12.25 (PERSONAL CARE ITEMS) ×2 IMPLANT
PAD PREP 24X41 OB/GYN DISP (PERSONAL CARE ITEMS) ×2 IMPLANT
SCRUB EXIDINE 4% CHG 4OZ (MISCELLANEOUS) ×2 IMPLANT
SYR 10ML LL (SYRINGE) ×2 IMPLANT
WATER STERILE IRR 500ML POUR (IV SOLUTION) ×2 IMPLANT

## 2021-09-30 NOTE — Discharge Instructions (Signed)
AMBULATORY SURGERY  ?DISCHARGE INSTRUCTIONS ? ? ?The drugs that you were given will stay in your system until tomorrow so for the next 24 hours you should not: ? ?Drive an automobile ?Make any legal decisions ?Drink any alcoholic beverage ? ? ?You may resume regular meals tomorrow.  Today it is better to start with liquids and gradually work up to solid foods. ? ?You may eat anything you prefer, but it is better to start with liquids, then soup and crackers, and gradually work up to solid foods. ? ? ?Please notify your doctor immediately if you have any unusual bleeding, trouble breathing, redness and pain at the surgery site, drainage, fever, or pain not relieved by medication. ? ? ? ?Additional Instructions: ? ? ? ?Please contact your physician with any problems or Same Day Surgery at [phone], Monday through Friday 6 am to 4 pm, or [facility] [place] Main number at [phone].  ?

## 2021-09-30 NOTE — Anesthesia Procedure Notes (Signed)
Procedure Name: LMA Insertion Date/Time: 09/30/2021 2:30 PM Performed by: Mathews Argyle, CRNA Pre-anesthesia Checklist: Patient identified, Patient being monitored, Timeout performed, Emergency Drugs available and Suction available Patient Re-evaluated:Patient Re-evaluated prior to induction Oxygen Delivery Method: Circle system utilized Preoxygenation: Pre-oxygenation with 100% oxygen Induction Type: IV induction Ventilation: Mask ventilation without difficulty LMA: LMA inserted LMA Size: 3.5 Tube type: Oral Number of attempts: 1 Placement Confirmation: positive ETCO2 and breath sounds checked- equal and bilateral Tube secured with: Tape Dental Injury: Teeth and Oropharynx as per pre-operative assessment

## 2021-09-30 NOTE — Op Note (Signed)
Procedure(s): DILATATION AND CURETTAGE Procedure Note  Lori Savage female 35 y.o. 09/30/2021  Indications: The patient is a 35 y.o. G38P1001 female with   Pre-operative Diagnosis:   Post-operative Diagnosis:   Surgeon: Hildred Laser, MD  Assistants:  None  Anesthesia: General endotracheal anesthesia  Findings: The uterus was sounded to 9 Small amount of endometrial tissue noted, collected specimen for evaluation.   Procedure Details: The patient was seen in the Holding Room. The risks, benefits, complications, treatment options, and expected outcomes were discussed with the patient.  The patient concurred with the proposed plan, giving informed consent.  The site of surgery properly noted/marked. The patient was taken to the Operating Room, identified as [name] and the procedure verified as Procedure(s) (LRB): DILATATION AND CURETTAGE (N/A). A Time Out was held and the above information confirmed.  She was then placed under general anesthesia without difficulty. She was placed in the dorsal lithotomy position, and was prepped and draped in a sterile manner.  A straight catheterization was performed. A sterile speculum was inserted into the vagina and the cervix was grasped at the anterior lip using a single-toothed tenaculum.  The uterus was sounded to 9 cm.  A smooth curette was then passed into the uterus and a curettage was performed.   Next, a serrated curette was then passed into the uterine cavity and a second curettage was performed. Tissue was collected, to be sent to pathology. The tenaculum was removed from the cervix, good hemostasis was noted. The speculum was removed from the vagina.   All sponge and instrument counts were correct x 2 at the conclusion of the procedure. The patient was awakened from anesthesia and taken to the PACU in stable condition.  She tolerated the procedure well.   Estimated Blood Loss:  15 ml      Drains: straight catheterization prior  to procedure with 100 ml of clear urine         Total IV Fluids: 300 ml  Specimens: Endometrial curetting         Implants: None         Complications:  None; patient tolerated the procedure well.         Disposition: PACU - hemodynamically stable.         Condition: stable   Hildred Laser, MD [facility]

## 2021-09-30 NOTE — Interval H&P Note (Signed)
History and Physical Interval Note:  09/30/2021 1:55 PM  Lori Savage  has presented today for surgery, with the diagnosis of dysfunctional uterine bleeding.  The various methods of treatment have been discussed with the patient and family. After consideration of risks, benefits and other options for treatment, the patient has consented to  Procedure(s): DILATATION AND CURETTAGE (N/A) as a surgical intervention.  The patient's history has been reviewed, patient examined, no change in status, stable for surgery.  I have reviewed the patient's chart and labs.  Questions were answered to the patient's satisfaction.     Hildred Laser, MD [facility]

## 2021-09-30 NOTE — Anesthesia Preprocedure Evaluation (Addendum)
Anesthesia Evaluation  Patient identified by MRN, date of birth, ID band Patient awake    Reviewed: Allergy & Precautions, NPO status , Patient's Chart, lab work & pertinent test results  Airway Mallampati: II  TM Distance: >3 FB Neck ROM: Full    Dental no notable dental hx.    Pulmonary neg pulmonary ROS,    Pulmonary exam normal        Cardiovascular negative cardio ROS Normal cardiovascular exam     Neuro/Psych PSYCHIATRIC DISORDERS Anxiety Depression negative neurological ROS  negative psych ROS   GI/Hepatic negative GI ROS, Neg liver ROS,   Endo/Other  negative endocrine ROSHypothyroidism   Renal/GU negative Renal ROS  negative genitourinary   Musculoskeletal negative musculoskeletal ROS (+)   Abdominal   Peds negative pediatric ROS (+)  Hematology negative hematology ROS (+)   Anesthesia Other Findings Anxiety    Hypothyroidism  no meds PCOS (polycystic ovarian syndrome)   Situational depression       Reproductive/Obstetrics negative OB ROS                            Anesthesia Physical Anesthesia Plan  ASA: 2  Anesthesia Plan: General   Post-op Pain Management:    Induction: Intravenous  PONV Risk Score and Plan: 2 and Midazolam and Propofol infusion  Airway Management Planned: LMA  Additional Equipment:   Intra-op Plan:   Post-operative Plan: Extubation in OR  Informed Consent: I have reviewed the patients History and Physical, chart, labs and discussed the procedure including the risks, benefits and alternatives for the proposed anesthesia with the patient or authorized representative who has indicated his/her understanding and acceptance.       Plan Discussed with: CRNA, Anesthesiologist and Surgeon  Anesthesia Plan Comments:        Anesthesia Quick Evaluation

## 2021-09-30 NOTE — Anesthesia Postprocedure Evaluation (Signed)
Anesthesia Post Note  Patient: Lori Savage  Procedure(s) Performed: DILATATION AND CURETTAGE  Patient location during evaluation: PACU Anesthesia Type: General Level of consciousness: awake and alert, awake and oriented Pain management: pain level controlled Vital Signs Assessment: post-procedure vital signs reviewed and stable Respiratory status: spontaneous breathing, nonlabored ventilation and respiratory function stable Cardiovascular status: blood pressure returned to baseline and stable Postop Assessment: no apparent nausea or vomiting Anesthetic complications: no   No notable events documented.   Last Vitals:  Vitals:   09/30/21 1530 09/30/21 1544  BP: (!) 116/101 (!) 124/93  Pulse: 92 87  Resp: (!) 23 17  Temp: (!) 36.3 C (!) 36.1 C  SpO2: 100% 100%    Last Pain:  Vitals:   09/30/21 1544  TempSrc: Temporal  PainSc: 0-No pain                 Manfred Arch

## 2021-09-30 NOTE — Transfer of Care (Signed)
Immediate Anesthesia Transfer of Care Note  Patient: Lori Savage  Procedure(s) Performed: DILATATION AND CURETTAGE  Patient Location: PACU  Anesthesia Type:General  Level of Consciousness: awake, alert  and oriented  Airway & Oxygen Therapy: Patient Spontanous Breathing  Post-op Assessment: Report given to RN and Post -op Vital signs reviewed and stable  Post vital signs: Reviewed and stable  Last Vitals:  Vitals Value Taken Time  BP 102/68 09/30/21 1500  Temp 36.3 C 09/30/21 1500  Pulse 67 09/30/21 1500  Resp 12 09/30/21 1500  SpO2 100 % 09/30/21 1500  Vitals shown include unvalidated device data.  Last Pain:  Vitals:   09/30/21 1500  TempSrc:   PainSc: 0-No pain         Complications: No notable events documented.

## 2021-10-01 ENCOUNTER — Encounter: Payer: Self-pay | Admitting: Obstetrics and Gynecology

## 2021-10-03 LAB — SURGICAL PATHOLOGY

## 2021-10-07 ENCOUNTER — Encounter: Payer: Self-pay | Admitting: Obstetrics and Gynecology

## 2021-10-07 ENCOUNTER — Ambulatory Visit (INDEPENDENT_AMBULATORY_CARE_PROVIDER_SITE_OTHER): Payer: 59 | Admitting: Obstetrics and Gynecology

## 2021-10-07 ENCOUNTER — Other Ambulatory Visit: Payer: Self-pay

## 2021-10-07 VITALS — BP 105/69 | HR 77 | Ht 63.0 in | Wt 175.4 lb

## 2021-10-07 DIAGNOSIS — E282 Polycystic ovarian syndrome: Secondary | ICD-10-CM

## 2021-10-07 DIAGNOSIS — Z4889 Encounter for other specified surgical aftercare: Secondary | ICD-10-CM

## 2021-10-07 DIAGNOSIS — Z9889 Other specified postprocedural states: Secondary | ICD-10-CM

## 2021-10-07 DIAGNOSIS — Z8742 Personal history of other diseases of the female genital tract: Secondary | ICD-10-CM

## 2021-10-07 NOTE — Progress Notes (Signed)
    OBSTETRICS/GYNECOLOGY POST-OPERATIVE CLINIC VISIT  Subjective:     Lori Savage is a 35 y.o. G73P1001 female who presents to the clinic 1 weeks status post  dilation and curettage  for abnormal uterine bleeding. Eating a regular diet without difficulty. The patient is not having any pain.  She denies any further bleeding at this time.   The following portions of the patient's history were reviewed and updated as appropriate: allergies, current medications, past family history, past medical history, past social history, past surgical history, and problem list.  Review of Systems Pertinent items noted in HPI and remainder of comprehensive ROS otherwise negative.   Objective:   BP 105/69   Pulse 77   Ht 5\' 3"  (1.6 m)   Wt 175 lb 6.4 oz (79.6 kg)   LMP 09/20/2021 (Exact Date) Comment: Negative test 09/30/2021  BMI 31.07 kg/m  Body mass index is 31.07 kg/m.  General:  alert and no distress  Abdomen: soft, bowel sounds active, non-tender  Incision:   healing well, no drainage, no erythema, no hernia, no seroma, no swelling, no dehiscence, incision well approximated    Pathology:   A. ENDOMETRIUM; CURETTAGE:  - WEAKLY PROLIFERATIVE ENDOMETRIUM WITH MILD STROMAL DECIDUALIZATION,  AND GLANDULAR AND STROMAL BREAKDOWN.  - NEGATIVE FOR ATYPICAL HYPERPLASIA/EIN AND MALIGNANCY.   Assessment:   Patient s/p dilation and curettage for DUB Doing well postoperatively. H/o PCOS  Plan:   1. Continue any current medications as ineeded.  Can discontinue OCPs at this time.   2. Operative findings again reviewed. Pathology report discussed. 3. Activity restrictions: none 4. Anticipated return to work: now. 5. Advised to begin tracking cycles and get ovulation kits as conception is desired.  If no ovulation over next 2 cycles, to return for further management as she does have a h/o PCOS.  If ovulation is noted, advised to f/u in 6 weeks if no pregnancy. To begin taking a PNV.      10/02/2021, MD [facility]

## 2022-02-24 ENCOUNTER — Encounter: Payer: Self-pay | Admitting: Obstetrics and Gynecology

## 2022-03-10 NOTE — Progress Notes (Signed)
? ? ?  GYNECOLOGY PROGRESS NOTE ? ?Subjective:  ? ? Patient ID: Lori Savage, female    DOB: July 16, 1986, 36 y.o.   MRN: [id number] ? ?HPI ? Patient is a 36 y.o. G65P1001 female with a h/o PCOS who presents for follow up.  She underwent a D&C for persistent abnormal uterine bleeding after failed medical management in November. States that she did not have a cycle in December, but did have one January through March. Has not yet had a cycle this month.  Still desiring conception.  ? ?Also has questions regarding natural supplements for management of PCOS that she has researched online.   ? ?The following portions of the patient's history were reviewed and updated as appropriate: allergies, current medications, past family history, past medical history, past social history, past surgical history, and problem list. ? ?Review of Systems ?Pertinent items noted in HPI and remainder of comprehensive ROS otherwise negative.  ? ?Objective:  ? Blood pressure (!) 125/94, pulse 89, resp. rate 16, height 5\' 3"  (1.6 m), weight 177 lb 3.2 oz (80.4 kg). Body mass index is 31.39 kg/m? Patient's last menstrual period was 02/02/2022 (exact date). ? ?General appearance: alert and no distress ?Remainder of exam deferred.  ? ? ?Assessment:  ? ?1. PCOS (polycystic ovarian syndrome)   ?2. Amenorrhea   ?  ? ?Plan:  ? ?- Reviewed again last PCOS lab profile with patient from last  year at her request. Discussed natural supplements with patient. Also discussed Inositol for fertility. Patient also working on dietary modification and lifestyle changes.  ? ?- Patient initially noted return of normal menstrual cycles, however has not had a cycle this month. Has not taken a pregnancy test as she notes that with her last pregnancy she had several negative urine tests but pregnancy was discovered with blood test. Will order B-hCG today.  ? ?Advised that if no pregnancy in the next 2 months, can consider use of ovarian stimulation medications.   ? ?02/04/2022, MD ?[facility] ? ?

## 2022-03-11 ENCOUNTER — Encounter: Payer: Self-pay | Admitting: Obstetrics and Gynecology

## 2022-03-11 ENCOUNTER — Ambulatory Visit (INDEPENDENT_AMBULATORY_CARE_PROVIDER_SITE_OTHER): Payer: Self-pay | Admitting: Obstetrics and Gynecology

## 2022-03-11 VITALS — BP 125/94 | HR 89 | Resp 16 | Ht 63.0 in | Wt 177.2 lb

## 2022-03-11 DIAGNOSIS — N912 Amenorrhea, unspecified: Secondary | ICD-10-CM

## 2022-03-11 DIAGNOSIS — E282 Polycystic ovarian syndrome: Secondary | ICD-10-CM

## 2022-03-12 LAB — BETA HCG QUANT (REF LAB): hCG Quant: <1 m[IU]/mL

## 2022-03-18 ENCOUNTER — Encounter: Payer: Self-pay | Admitting: Obstetrics and Gynecology

## 2022-03-18 MED ORDER — MEDROXYPROGESTERONE ACETATE 10 MG PO TABS: 10.0000 mg | ORAL_TABLET | Freq: Every day | ORAL | 2 refills | Status: DC

## 2022-05-14 ENCOUNTER — Telehealth: Payer: Self-pay | Admitting: Obstetrics and Gynecology

## 2022-05-14 NOTE — Telephone Encounter (Signed)
Pt states she has not had any results using the Provera. Pt states she used the last 9-10 pills with no results. Pt states it feels like her cycle is going to start but then the cramps subside and there is nothing. Please return call .

## 2022-05-15 MED ORDER — NORGESTIMATE-ETH ESTRADIOL 0.25-35 MG-MCG PO TABS: 1.0000 | ORAL_TABLET | Freq: Every day | ORAL | 0 refills | Status: DC

## 2022-05-21 ENCOUNTER — Telehealth: Payer: Self-pay | Admitting: Obstetrics and Gynecology

## 2022-05-21 NOTE — Telephone Encounter (Signed)
Pt states her cycle started on 07/1 . Pt is uncertain of when she should take her BC pills. Pt is concerned that if she starts the pills will her cycle stop or have multiple cycles this month. Pt is asking for a return call.

## 2022-06-03 ENCOUNTER — Encounter: Payer: Self-pay | Admitting: Obstetrics and Gynecology

## 2022-09-09 ENCOUNTER — Other Ambulatory Visit: Payer: Self-pay | Admitting: Obstetrics and Gynecology

## 2022-10-11 ENCOUNTER — Other Ambulatory Visit: Payer: Self-pay | Admitting: Obstetrics and Gynecology

## 2022-10-15 ENCOUNTER — Encounter: Payer: Self-pay | Admitting: Obstetrics and Gynecology

## 2022-10-15 IMAGING — US US PELVIS COMPLETE WITH TRANSVAGINAL
1 series · 15 of 25 positions shown · non-contrast
Comparison: None

CLINICAL DATA: History of PCOS with heavy menstrual cycles, initial
encounter



[Series 1: us pelvis complete · 15 of 130 slices shown]
[im 1/130]
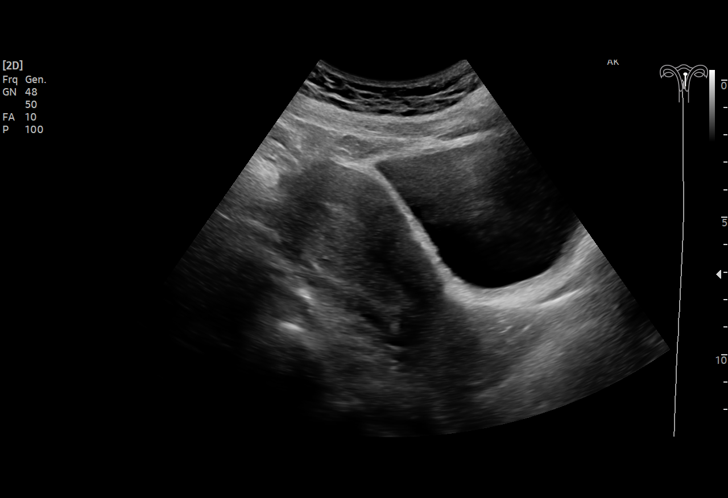
[im 11/130]
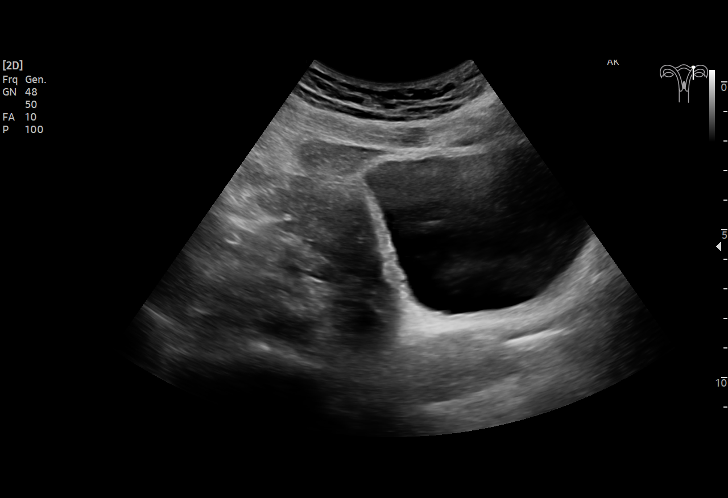
[im 22/130]
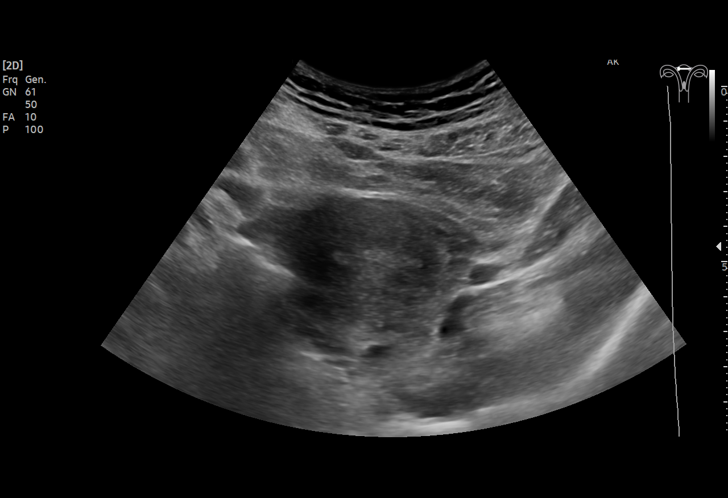
[im 27/130]
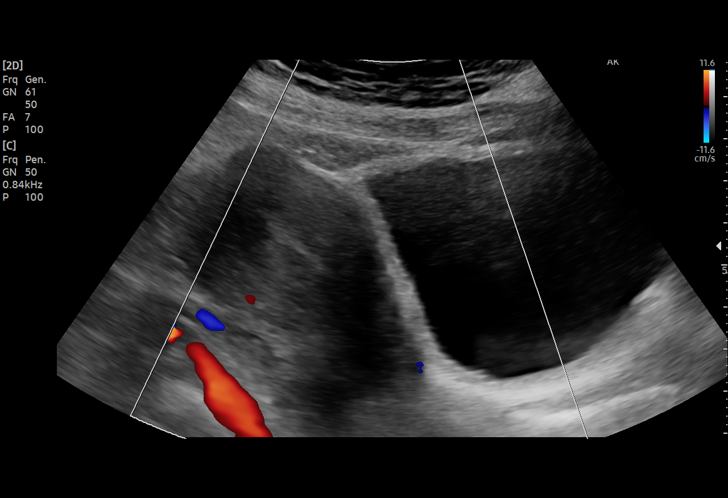
[im 38/130]
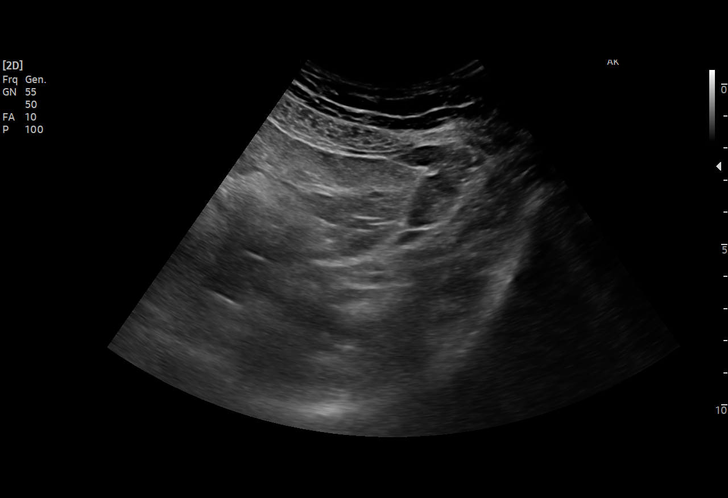
[im 49/130]
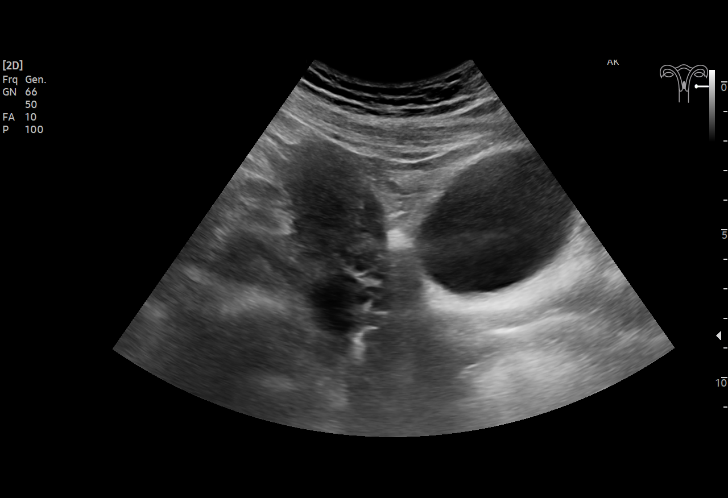
[im 54/130]
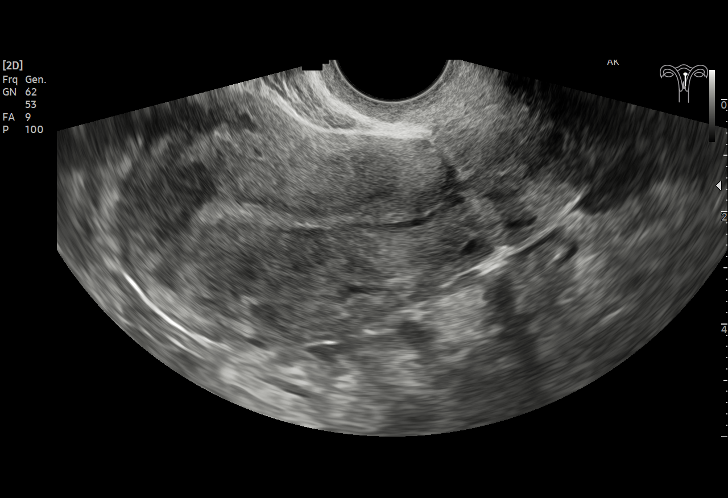
[im 65/130]
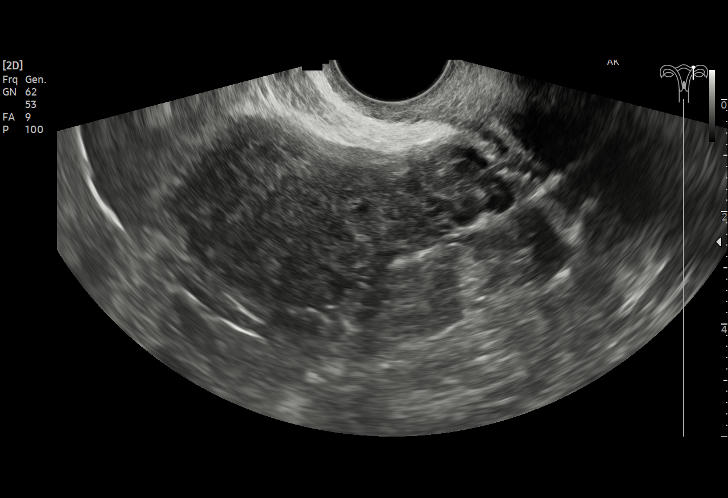
[im 76/130]
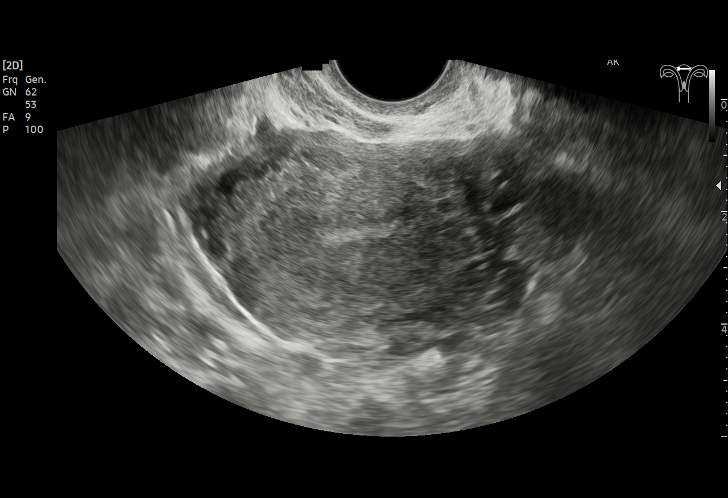
[im 81/130]
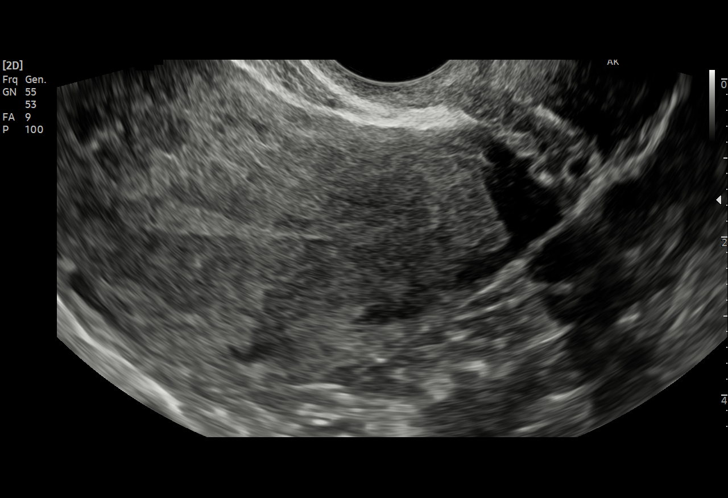
[im 92/130]
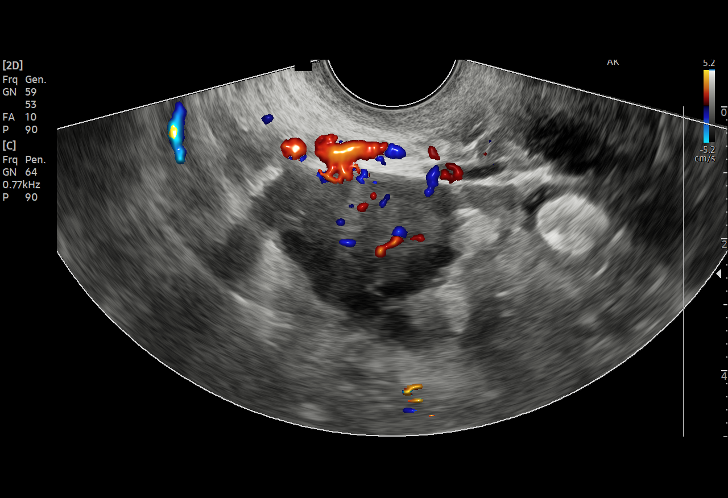
[im 103/130]
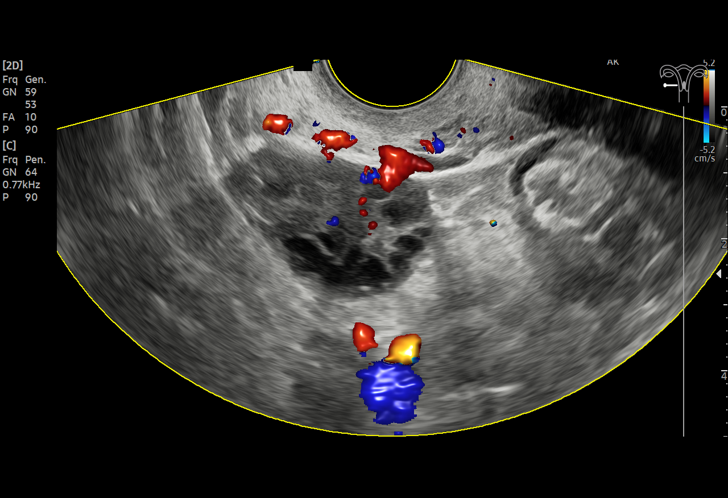
[im 108/130]
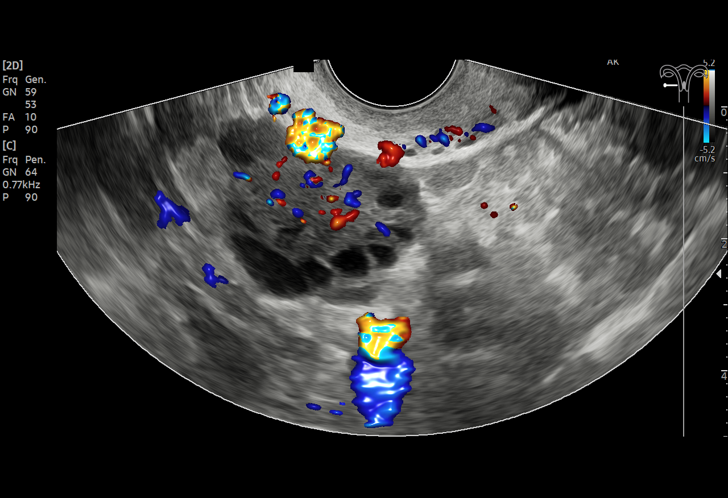
[im 119/130]
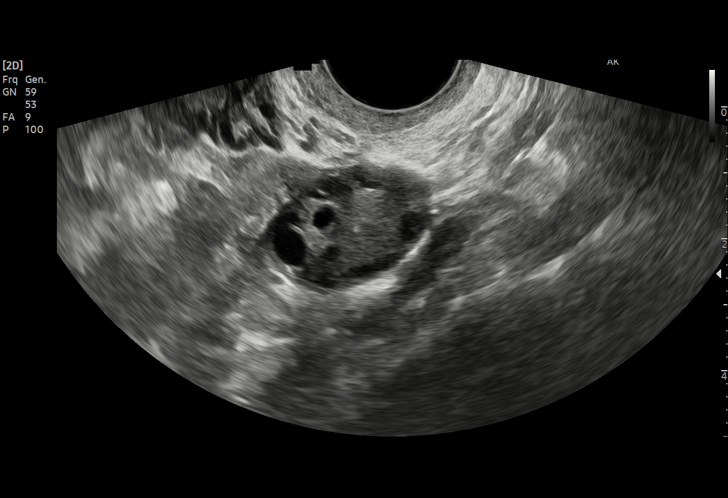
[im 130/130]
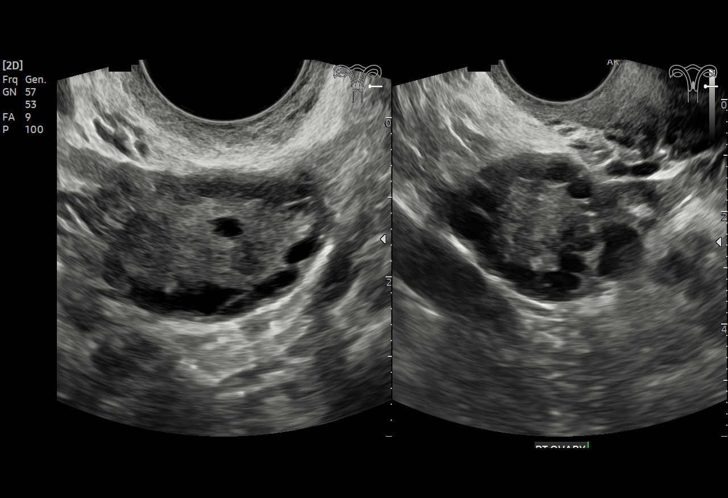

[15 of 25 positions shown; findings below may reference images not displayed]

FINDINGS: Uterus

Measurements: 10.4 x 4.6 x 5.3 cm. = volume: 120 mL. No fibroids or
other mass visualized.

Endometrium

Thickness: 3 mm.  No focal abnormality visualized.

Right ovary

Measurements: 2.7 x 2.3 x 2.5 cm = volume: 7.7 mL. Normal
appearance/no adnexal mass.

Left ovary

Measurements: 2.5 x 1.8 x 1.9 cm. = volume: 4.4 mL. Normal
appearance/no adnexal mass.

Other findings

No abnormal free fluid.
IMPRESSION: Unremarkable pelvic ultrasound.

## 2022-12-02 ENCOUNTER — Other Ambulatory Visit (HOSPITAL_COMMUNITY)
Admission: RE | Admit: 2022-12-02 | Discharge: 2022-12-02 | Disposition: A | Discharge status: Home / Self Care | Payer: BC Managed Care – PPO | Source: Ambulatory Visit | Attending: Obstetrics and Gynecology | Admitting: Obstetrics and Gynecology

## 2022-12-02 ENCOUNTER — Encounter: Payer: Self-pay | Admitting: Obstetrics and Gynecology

## 2022-12-02 ENCOUNTER — Ambulatory Visit (INDEPENDENT_AMBULATORY_CARE_PROVIDER_SITE_OTHER): Payer: BC Managed Care – PPO | Admitting: Obstetrics and Gynecology

## 2022-12-02 VITALS — BP 113/72 | HR 89 | Resp 16 | Ht 62.0 in | Wt 182.1 lb

## 2022-12-02 DIAGNOSIS — Z1159 Encounter for screening for other viral diseases: Secondary | ICD-10-CM

## 2022-12-02 DIAGNOSIS — E669 Obesity, unspecified: Secondary | ICD-10-CM | POA: Insufficient documentation

## 2022-12-02 DIAGNOSIS — Z23 Encounter for immunization: Secondary | ICD-10-CM | POA: Diagnosis not present

## 2022-12-02 DIAGNOSIS — Z124 Encounter for screening for malignant neoplasm of cervix: Secondary | ICD-10-CM | POA: Insufficient documentation

## 2022-12-02 DIAGNOSIS — Z01419 Encounter for gynecological examination (general) (routine) without abnormal findings: Secondary | ICD-10-CM | POA: Diagnosis not present

## 2022-12-02 DIAGNOSIS — N920 Excessive and frequent menstruation with regular cycle: Secondary | ICD-10-CM

## 2022-12-02 DIAGNOSIS — Z8742 Personal history of other diseases of the female genital tract: Secondary | ICD-10-CM | POA: Insufficient documentation

## 2022-12-02 DIAGNOSIS — Z3041 Encounter for surveillance of contraceptive pills: Secondary | ICD-10-CM

## 2022-12-02 DIAGNOSIS — E6609 Other obesity due to excess calories: Secondary | ICD-10-CM | POA: Insufficient documentation

## 2022-12-02 MED ORDER — NORGESTIMATE-ETH ESTRADIOL 0.25-35 MG-MCG PO TABS: 1.0000 | ORAL_TABLET | Freq: Every day | ORAL | 3 refills | Status: DC

## 2022-12-02 NOTE — Patient Instructions (Signed)
Tdap (Tetanus, Diphtheria, Pertussis) Vaccine: What You Need to Know 1. Why get vaccinated? Tdap vaccine can prevent tetanus, diphtheria, and pertussis. Diphtheria and pertussis spread from person to person. Tetanus enters the body through cuts or wounds. TETANUS (T) causes painful stiffening of the muscles. Tetanus can lead to serious health problems, including being unable to open the mouth, having trouble swallowing and breathing, or death. DIPHTHERIA (D) can lead to difficulty breathing, heart failure, paralysis, or death. PERTUSSIS (aP), also known as "whooping cough," can cause uncontrollable, violent coughing that makes it hard to breathe, eat, or drink. Pertussis can be extremely serious especially in babies and young children, causing pneumonia, convulsions, brain damage, or death. In teens and adults, it can cause weight loss, loss of bladder control, passing out, and rib fractures from severe coughing. 2. Tdap vaccine Tdap is only for children 7 years and older, adolescents, and adults.  Adolescents should receive a single dose of Tdap, preferably at age 10 or 71 years. Pregnant people should get a dose of Tdap during every pregnancy, preferably during the early part of the third trimester, to help protect the newborn from pertussis. Infants are most at risk for severe, life-threatening complications from pertussis. Adults who have never received Tdap should get a dose of Tdap. Also, adults should receive a booster dose of either Tdap or Td (a different vaccine that protects against tetanus and diphtheria but not pertussis) every 10 years, or after 5 years in the case of a severe or dirty wound or burn. Tdap may be given at the same time as other vaccines. 3. Talk with your health care provider Tell your vaccine provider if the person getting the vaccine: Has had an allergic reaction after a previous dose of any vaccine that protects against tetanus, diphtheria, or pertussis, or has any  severe, life-threatening allergies Has had a coma, decreased level of consciousness, or prolonged seizures within 7 days after a previous dose of any pertussis vaccine (DTP, DTaP, or Tdap) Has seizures or another nervous system problem Has ever had Guillain-Barr Syndrome (also called "GBS") Has had severe pain or swelling after a previous dose of any vaccine that protects against tetanus or diphtheria In some cases, your health care provider may decide to postpone Tdap vaccination until a future visit. People with minor illnesses, such as a cold, may be vaccinated. People who are moderately or severely ill should usually wait until they recover before getting Tdap vaccine.  Your health care provider can give you more information. 4. Risks of a vaccine reaction Pain, redness, or swelling where the shot was given, mild fever, headache, feeling tired, and nausea, vomiting, diarrhea, or stomachache sometimes happen after Tdap vaccination. People sometimes faint after medical procedures, including vaccination. Tell your provider if you feel dizzy or have vision changes or ringing in the ears.  As with any medicine, there is a very remote chance of a vaccine causing a severe allergic reaction, other serious injury, or death. 5. What if there is a serious problem? An allergic reaction could occur after the vaccinated person leaves the clinic. If you see signs of a severe allergic reaction (hives, swelling of the face and throat, difficulty breathing, a fast heartbeat, dizziness, or weakness), call 9-1-1 and get the person to the nearest hospital. For other signs that concern you, call your health care provider.  Adverse reactions should be reported to the Vaccine Adverse Event Reporting System (VAERS). Your health care provider will usually file this report, or you  can do it yourself. Visit the VAERS website at www.vaers.hhs.gov or call 1-800-822-7967. VAERS is only for reporting reactions, and VAERS staff  members do not give medical advice. 6. The National Vaccine Injury Compensation Program The National Vaccine Injury Compensation Program (VICP) is a federal program that was created to compensate people who may have been injured by certain vaccines. Claims regarding alleged injury or death due to vaccination have a time limit for filing, which may be as short as two years. Visit the VICP website at www.hrsa.gov/vaccinecompensation or call 1-800-338-2382 to learn about the program and about filing a claim. 7. How can I learn more? Ask your health care provider. Call your local or state health department. Visit the website of the Food and Drug Administration (FDA) for vaccine package inserts and additional information at www.fda.gov/vaccines-blood-biologics/vaccines. Contact the Centers for Disease Control and Prevention (CDC): Call 1-800-232-4636 (1-800-CDC-INFO) or Visit CDC's website at www.cdc.gov/vaccines. Source: CDC Vaccine Information Statement Tdap (Tetanus, Diphtheria, Pertussis) Vaccine (06/22/2020) This same material is available at www.cdc.gov for no charge. This information is not intended to replace advice given to you by your health care provider. Make sure you discuss any questions you have with your health care provider. Document Revised: 10/01/2021 Document Reviewed: 08/05/2021 Elsevier Patient Education  2023 [employer]  

## 2022-12-02 NOTE — Progress Notes (Signed)
GYNECOLOGY ANNUAL PHYSICAL EXAM PROGRESS NOTE  Subjective:    Lori Savage is a 37 y.o. G5P1001 female who presents for an annual exam.  The patient is sexually active. The patient participates in regular exercise: no. Has the patient ever been transfused or tattooed?: yes. The patient reports that there is not domestic violence in her life.   The patient has the following complaints today.  She had been having heavy bleeding with clots x 10 days. Currently notes bleeding has slowed down. Bleeding has turned into spotting today.  She reports feeling like she was going to pass out yesterday. She has been without her birth control pills since November (notes that she was told she could not get the medication refilled until being scheduled for appointment).    Menstrual History: Menarche age: 80 No LMP recorded. Period Duration (Days): 10 Period Pattern: (!) Irregular Menstrual Flow: Moderate Menstrual Control: Maxi pad Menstrual Control Change Freq (Hours): 2 Dysmenorrhea: None   Gynecologic History:  Contraception: OCP (estrogen/progesterone) History of STI's: Trichomonas Last Pap: 06/24/2019. Results were: normal.  Denies h/o abnormal pap smears.   OB History  Gravida Para Term Preterm AB Living  1 1 1  0 0 1  SAB IAB Ectopic Multiple Live Births  0 0 0 0 1    # Outcome Date GA Lbr Len/2nd Weight Sex Delivery Anes PTL Lv  1 Term 03/06/09   6 lb 15 oz (3.147 kg) M CS-Unspec EPI N LIV     Complications: Failure to Progress in Second Stage, Fetal Intolerance     Name: Toraj    Past Medical History:  Diagnosis Date   Anxiety    Hypothyroidism    no meds   PCOS (polycystic ovarian syndrome)    Situational depression     Past Surgical History:  Procedure Laterality Date   CESAREAN SECTION     emergent section-was put to sleep for section   DILATION AND CURETTAGE OF UTERUS N/A 09/30/2021   Procedure: DILATATION AND CURETTAGE;  Surgeon: Rubie Maid, MD;   Location: [facility] ORS;  Service: Gynecology;  Laterality: N/A;    Family History  Problem Relation Age of Onset   Lupus Mother    Epilepsy Mother    Breast cancer Maternal Grandmother    Diabetes Maternal Grandmother    Ovarian cancer Neg Hx    Colon cancer Neg Hx     Social History   Socioeconomic History   Marital status: Single    Spouse name: Not on file   Number of children: Not on file   Years of education: Not on file   Highest education level: Not on file  Occupational History   Not on file  Tobacco Use   Smoking status: Never   Smokeless tobacco: Never  Vaping Use   Vaping Use: Never used  Substance and Sexual Activity   Alcohol use: Not Currently    Comment: social   Drug use: No   Sexual activity: Yes    Birth control/protection: None  Other Topics Concern   Not on file  Social History Narrative   Not on file   Social Determinants of Health   Financial Resource Strain: Not on file  Food Insecurity: Not on file  Transportation Needs: Not on file  Physical Activity: Not on file  Stress: Not on file  Social Connections: Not on file  Intimate Partner Violence: Not on file    Current Outpatient Medications on File Prior to Visit  Medication  Sig Dispense Refill   Biotin 1000 MCG CHEW Chew 1,000 mcg by mouth daily.     Cholecalciferol (VITAMIN D3) 250 MCG (10000 UT) capsule Take 10,000 Units by mouth daily.     Magnesium 250 MG TABS Take 250 mg by mouth daily.     norgestimate-ethinyl estradiol (ORTHO-CYCLEN) 0.25-35 MG-MCG tablet Take 1 tablet by mouth daily. 84 tablet 0   Probiotic Product (PROBIOTIC/PREBIOTIC/CRANBERRY) CAPS Take 1 capsule by mouth daily.     zinc gluconate 50 MG tablet Take 50 mg by mouth daily.     No current facility-administered medications on file prior to visit.    Allergies  Allergen Reactions   Coconut Flavor Hives    All coconut products per patient     Review of Systems Constitutional: negative for chills, fatigue,  fevers and sweats Eyes: negative for irritation, redness and visual disturbance Ears, nose, mouth, throat, and face: negative for hearing loss, nasal congestion, snoring and tinnitus Respiratory: negative for asthma, cough, sputum Cardiovascular: negative for chest pain, dyspnea, exertional chest pressure/discomfort, irregular heart beat, palpitations and syncope Gastrointestinal: negative for abdominal pain, change in bowel habits, nausea and vomiting Genitourinary: negative for abnormal menstrual periods, genital lesions, sexual problems and vaginal discharge, dysuria and urinary incontinence Integument/breast: negative for breast lump, breast tenderness and nipple discharge Hematologic/lymphatic: negative for bleeding and easy bruising Musculoskeletal:negative for back pain and muscle weakness Neurological: negative for dizziness, headaches, vertigo and weakness Endocrine: negative for diabetic symptoms including polydipsia, polyuria and skin dryness Allergic/Immunologic: negative for hay fever and urticaria   Objective:  Blood pressure 113/72, pulse 89, resp. rate 16, height 5\' 2"  (1.575 m), weight 182 lb 1.6 oz (82.6 kg).  Body mass index is 33.31 kg/m.    General Appearance:    Alert, cooperative, no distress, appears stated age, mild obesity  Head:    Normocephalic, without obvious abnormality, atraumatic  Eyes:    PERRL, conjunctiva/corneas clear, EOM's intact, both eyes  Ears:    Normal external ear canals, both ears  Nose:   Nares normal, septum midline, mucosa normal, no drainage or sinus tenderness  Throat:   Lips, mucosa, and tongue normal; teeth and gums normal  Neck:   Supple, symmetrical, trachea midline, no adenopathy; thyroid: no enlargement/tenderness/nodules; no carotid bruit or JVD  Back:     Symmetric, no curvature, ROM normal, no CVA tenderness  Lungs:     Clear to auscultation bilaterally, respirations unlabored  Chest Wall:    No tenderness or deformity   Heart:     Regular rate and rhythm, S1 and S2 normal, no murmur, rub or gallop  Breast Exam:    No tenderness, masses, or nipple abnormality  Abdomen:     Soft, non-tender, bowel sounds active all four quadrants, no masses, no organomegaly.    Genitalia:    Pelvic:external genitalia normal, vagina without lesions, discharge, or tenderness, scant dark red blood in vaginal vault.  Rectovaginal septum  normal. Cervix normal in appearance, no cervical motion tenderness, no adnexal masses or tenderness.  Uterus normal size, shape, mobile, regular contours, nontender.  Rectal:    Normal external sphincter.  No hemorrhoids appreciated. Internal exam not done.   Extremities:   Extremities normal, atraumatic, no cyanosis or edema  Pulses:   2+ and symmetric all extremities  Skin:   Skin color, texture, turgor normal, no rashes or lesions  Lymph nodes:   Cervical, supraclavicular, and axillary nodes normal  Neurologic:   CNII-XII intact, normal strength, sensation and reflexes  throughout   .  Labs:  Lab Results  Component Value Date   WBC 7.3 09/30/2021   HGB 12.0 09/30/2021   HCT 36.7 09/30/2021   MCV 73.3 (L) 09/30/2021   PLT 406 (H) 09/30/2021    No results found for: "CREATININE", "BUN", "NA", "K", "CL", "CO2"  No results found for: "ALT", "AST", "GGT", "ALKPHOS", "BILITOT"  Lab Results  Component Value Date   TSH 0.459 06/01/2020     Assessment:   1. Encounter for well woman exam with routine gynecological exam   2. Surveillance for birth control, oral contraceptives   3. Cervical cancer screening   4. Need for Tdap vaccination   5. History of PCOS   6. Menorrhagia with regular cycle   7. Encounter for hepatitis C screening test for low risk patient   8. Obesity (BMI 30.0-34.9)      Plan:  Blood tests: see orders. Breast self exam technique reviewed and patient encouraged to perform self-exam monthly. Contraception: OCP (estrogen/progesterone). Refill given today. Can start today  as cycle ending.  Discussed healthy lifestyle modifications. Reviewed all vitamin supplements patient is taking.  Mammogram  : Not age appropriate .  Pap smear ordered. COVID vaccination status: Declined Flu vaccine: declined Encounter for Hepatitis C screening in low risk patient, ordered.  Tdap given today.  Menorrhagia should improve with resumption of her OCPs.  Follow up in 1 year for annual exam   Hildred Laser, MD [facility]

## 2022-12-03 LAB — LIPID PANEL
Chol/HDL Ratio: 2.1 ratio (ref 0.0–4.4)
Cholesterol, Total: 116 mg/dL (ref 100–199)
HDL: 55 mg/dL (ref >39)
LDL Chol Calc (NIH): 47 mg/dL (ref 0–99)
Triglycerides: 65 mg/dL (ref 0–149)
VLDL Cholesterol Cal: 14 mg/dL (ref 5–40)

## 2022-12-03 LAB — CBC
Hematocrit: 32.6 % — ABNORMAL LOW (ref 34.0–46.6)
Hemoglobin: 10 g/dL — ABNORMAL LOW (ref 11.1–15.9)
MCH: 22.5 pg — ABNORMAL LOW (ref 26.6–33.0)
MCHC: 30.7 g/dL — ABNORMAL LOW (ref 31.5–35.7)
MCV: 73 fL — ABNORMAL LOW (ref 79–97)
Platelets: 340 10*3/uL (ref 150–450)
RBC: 4.44 x10E6/uL (ref 3.77–5.28)
RDW: 14.2 % (ref 11.7–15.4)
WBC: 5.1 10*3/uL (ref 3.4–10.8)

## 2022-12-03 LAB — COMPREHENSIVE METABOLIC PANEL
ALT: 16 IU/L (ref 0–32)
AST: 17 IU/L (ref 0–40)
Albumin/Globulin Ratio: 2 (ref 1.2–2.2)
Albumin: 4.3 g/dL (ref 3.9–4.9)
Alkaline Phosphatase: 70 IU/L (ref 44–121)
BUN/Creatinine Ratio: 9 (ref 9–23)
BUN: 8 mg/dL (ref 6–20)
Bilirubin Total: 0.3 mg/dL (ref 0.0–1.2)
CO2: 22 mmol/L (ref 20–29)
Calcium: 9.3 mg/dL (ref 8.7–10.2)
Chloride: 103 mmol/L (ref 96–106)
Creatinine, Ser: 0.86 mg/dL (ref 0.57–1.00)
Globulin, Total: 2.2 g/dL (ref 1.5–4.5)
Glucose: 71 mg/dL (ref 70–99)
Potassium: 4.4 mmol/L (ref 3.5–5.2)
Sodium: 139 mmol/L (ref 134–144)
Total Protein: 6.5 g/dL (ref 6.0–8.5)
eGFR: 91 mL/min/{1.73_m2} (ref >59)

## 2022-12-03 LAB — HEMOGLOBIN A1C
Est. average glucose Bld gHb Est-mCnc: 105 mg/dL
Hgb A1c MFr Bld: 5.3 % (ref 4.8–5.6)

## 2022-12-03 LAB — HEPATITIS C ANTIBODY: Hep C Virus Ab: NONREACTIVE

## 2022-12-03 LAB — TSH: TSH: 0.83 u[IU]/mL (ref 0.450–4.500)

## 2022-12-11 LAB — CYTOLOGY - PAP
Comment: NEGATIVE
Diagnosis: NEGATIVE
High risk HPV: NEGATIVE

## 2022-12-16 ENCOUNTER — Ambulatory Visit: Payer: Self-pay | Admitting: Obstetrics and Gynecology

## 2023-01-03 DIAGNOSIS — J069 Acute upper respiratory infection, unspecified: Secondary | ICD-10-CM | POA: Diagnosis not present

## 2023-01-03 DIAGNOSIS — R059 Cough, unspecified: Secondary | ICD-10-CM | POA: Diagnosis not present

## 2023-01-03 DIAGNOSIS — Z20822 Contact with and (suspected) exposure to covid-19: Secondary | ICD-10-CM | POA: Diagnosis not present

## 2023-03-18 ENCOUNTER — Encounter: Payer: Self-pay | Admitting: Obstetrics and Gynecology

## 2023-03-23 MED ORDER — DROSPIRENONE-ETHINYL ESTRADIOL 3-0.02 MG PO TABS: 1.0000 | ORAL_TABLET | Freq: Every day | ORAL | 3 refills | Status: DC

## 2023-03-24 ENCOUNTER — Telehealth: Payer: Self-pay | Admitting: Obstetrics and Gynecology

## 2023-03-24 NOTE — Telephone Encounter (Signed)
I advised her if bleeding was heavy where she was changing her pad every hour she should go to the ER. She states she has been through this before and ER did not do anything. I offered her the appointment with Valentino Saxon she declined and said she will try the medication Valentino Saxon gave her to see if it helps. Pt aware to call if medication does not help and to go to ER if gets heavier. Lori Savage cma

## 2023-03-24 NOTE — Telephone Encounter (Signed)
The patient is calling to scheduled follow up with Dr. Valentino Saxon due to AUB. I offered the patient 5/9 at 11:15 am. The patient reports having to change a pad 3 times with in one hour due to hevay bleeding. The patient states she was given new prescription to try. I had Rosemarie Beath, triage nurse to advise patient due to heavy bleeding.

## 2023-04-07 ENCOUNTER — Encounter: Payer: Self-pay | Admitting: Obstetrics and Gynecology

## 2023-04-07 ENCOUNTER — Ambulatory Visit (INDEPENDENT_AMBULATORY_CARE_PROVIDER_SITE_OTHER): Payer: BC Managed Care – PPO | Admitting: Obstetrics and Gynecology

## 2023-04-07 VITALS — BP 108/80 | HR 76 | Ht 62.0 in | Wt 177.2 lb

## 2023-04-07 DIAGNOSIS — N938 Other specified abnormal uterine and vaginal bleeding: Secondary | ICD-10-CM

## 2023-04-07 DIAGNOSIS — Z30430 Encounter for insertion of intrauterine contraceptive device: Secondary | ICD-10-CM

## 2023-04-07 MED ORDER — LEVONORGESTREL 20 MCG/DAY IU IUD
1.0000 | INTRAUTERINE_SYSTEM | Freq: Once | INTRAUTERINE | Status: AC
  Administered 2023-04-07: 1 via INTRAUTERINE

## 2023-04-07 NOTE — Progress Notes (Signed)
     GYNECOLOGY OFFICE PROCEDURE NOTE  Lori Savage is a 37 y.o. G1P1001 here for Mirena IUD insertion. Continues to have DUB despite multiple OCP regimens. Has also had a D&C in the past for her bleeding.  Last pap smear was on 12/02/2022 and was normal.  IUD Insertion Procedure Note Patient identified, informed consent performed, consent signed.   Discussed risks of irregular bleeding, cramping, infection, malpositioning or misplacement of the IUD outside the uterus which may require further procedure such as laparoscopy. Also discussed >99% contraception efficacy, increased risk of ectopic pregnancy with failure of method.   Emphasized that this did not protect against STIs, condoms recommended during all sexual encounters. Time out was performed.  Urine pregnancy test not performed as patient currently bleeding.  Speculum placed in the vagina.  Cervix visualized.  Cleaned with Betadine x 2.  Grasped anteriorly with a single tooth tenaculum.  Uterus sounded to 9 cm.  Mirena IUD placed per manufacturer's recommendations.  Strings trimmed to 3 cm. Tenaculum was removed, good hemostasis noted.  Patient tolerated procedure well.   Patient was given post-procedure instructions.  She was advised to have backup contraception for one week.  Patient was also asked to check IUD strings periodically and follow up in 4 weeks for IUD check.   Lot: [id number] Exp:06/2025  Hildred Laser, MD [facility]

## 2023-04-07 NOTE — Patient Instructions (Signed)
Intrauterine Device Information An intrauterine device (IUD) is a medical device that is inserted into the uterus to prevent pregnancy. It is a small, T-shaped device that has one or two nylon strings hanging down from it. The strings hang out of the lower part of the uterus (cervix) to allow for future IUD removal. There are two types of IUDs: Hormone IUD. This type of IUD is made of plastic and contains the hormone progestin (synthetic progesterone). A hormone IUD may last 3-5 years. Copper IUD. This type of IUD has copper wire wrapped around it. A copper IUD may last up to 10 years. How is an IUD inserted? An IUD is inserted through the vagina, through the cervix, and into the uterus with a minor medical procedure. The procedure for IUD insertion may vary among health care providers and hospitals. How does an IUD work? Synthetic progesterone in a hormonal IUD prevents pregnancy by: Thickening cervical mucus to prevent sperm from entering the uterus. Thinning the uterine lining to prevent a fertilized egg from being implanted there. Copper in a copper IUD prevents pregnancy by making the uterus and fallopian tubes produce a fluid that kills sperm. What are the advantages of an IUD? Advantages of either type of IUD An IUD: Is highly effective in preventing pregnancy. Is reversible. You can become pregnant shortly after the IUD is removed. Is low-maintenance and can stay in place for a long time. Has no estrogen-related side effects. Can be used when breastfeeding. Is not associated with weight gain. Can be inserted right after childbirth, an abortion, or a miscarriage. Advantages of a hormone IUD If it is inserted within 7 days of your period starting, it works right after it has been inserted. If the hormone IUD is inserted at any other time in your cycle, you will need to use a backup method of birth control for 7 days after insertion. It can make menstrual periods lighter or stop  completely. It can reduce menstrual cramping and other discomforts from menstrual periods. It can be used for 3-5 years, depending on which IUD you have. Advantages of a copper IUD It works right after it is inserted. It can be used as a form of emergency birth control if it is inserted within 5 days after having unprotected sex. It does not interfere with your body's natural hormones. It can be used for up to 10 years. What are the disadvantages of an IUD? An IUD may cause irregular menstrual bleeding for a period of time after insertion. It is common to have pain during insertion and have cramping and vaginal bleeding after insertion. An IUD may cut the uterus (uterine perforation) when it is inserted. This is rare. Pelvic inflammatory disease (PID) may happen after insertion of an IUD. PID is an infection in the uterus and fallopian tubes. The IUD does not cause the infection. The infection is usually from an unknown sexually transmitted infection (STI). This is rare, and it usually happens during the first 20 days after the IUD is inserted. A copper IUD can make your menstrual flow heavier and more painful. IUDs cannot prevent sexually transmitted infections (STIs). How is an IUD removed?  You will lie on your back with your knees bent and your feet in footrests (stirrups). A device will be inserted into your vagina to spread apart the vaginal walls (speculum). This will allow your health care provider to see the strings attached to the IUD. Your health care provider will use a small instrument (forceps) to   grasp the IUD strings and will pull firmly until the IUD is removed. You may have some discomfort when the IUD is removed. Your health care provider may recommend taking over-the-counter pain relievers, such as ibuprofen, before the procedure. You may also have minor spotting for a few days after the procedure. The procedure for IUD removal may vary among health care providers and  hospitals. Is an IUD right for me? If you are interested in an IUD, discuss it with your health care provider. He or she will make sure you are a good candidate for an IUD and will let you know more about the advantages, disadvantage, and possible side effects. This will allow you to make a decision about the device. Summary An intrauterine device (IUD) is a medical device that is inserted in the uterus to prevent pregnancy. It is a small, T-shaped device that has one or two nylon strings hanging down from it. A hormone IUD contains the hormone progestin (synthetic progesterone). A copper IUD has copper wire wrapped around it. Synthetic progesterone in a hormone IUD prevents pregnancy by thickening cervical mucus and thinning the walls of the uterus. Copper in a copper IUD prevents pregnancy by making the uterus and fallopian tubes produce a fluid that kills sperm. A hormone IUD can be left in place for 3-5 years. A copper IUD can be left in place for up to 10 years. An IUD is inserted and removed by a health care provider. You may feel some pain during insertion and removal. Your health care provider may recommend taking over-the-counter pain medicine, such as ibuprofen, before an IUD procedure. This information is not intended to replace advice given to you by your health care provider. Make sure you discuss any questions you have with your health care provider. Document Revised: 05/16/2020 Document Reviewed: 05/16/2020 Elsevier Patient Education  2023 Else[employer] Inc[employer] IUD PLACEMENT POST-PROCEDURE INSTRUCTIONS  You may take Ibuprofen, Aleve or Tylenol for pain if needed.  Cramping should resolve within in 24 hours.  You may have a small amount of spotting.  You should wear a mini pad for the next few days.  You may have intercourse after 24 hours.  If you using this for birth control, it is effective immediately.  You need to call if you have any pelvic pain, fever, heavy bleeding or foul  smelling vaginal discharge.  Irregular bleeding is common the first several months after having an IUD placed. You do not need to call for this reason unless you are concerned.  Shower or bathe as normal  You should have a follow-up appointment in 4-8 weeks for a re-check to make sure you are not having any problems. 

## 2023-04-08 ENCOUNTER — Encounter: Payer: Self-pay | Admitting: Obstetrics and Gynecology

## 2023-04-17 ENCOUNTER — Emergency Department
Admission: EM | Admit: 2023-04-17 | Discharge: 2023-04-17 | Discharge status: Against Medical Advice | Payer: BC Managed Care – PPO | Attending: Emergency Medicine | Admitting: Emergency Medicine

## 2023-04-17 ENCOUNTER — Other Ambulatory Visit: Payer: Self-pay

## 2023-04-17 DIAGNOSIS — R7309 Other abnormal glucose: Secondary | ICD-10-CM | POA: Diagnosis not present

## 2023-04-17 DIAGNOSIS — Z5321 Procedure and treatment not carried out due to patient leaving prior to being seen by health care provider: Secondary | ICD-10-CM | POA: Diagnosis not present

## 2023-04-17 DIAGNOSIS — N939 Abnormal uterine and vaginal bleeding, unspecified: Secondary | ICD-10-CM | POA: Insufficient documentation

## 2023-04-17 LAB — BASIC METABOLIC PANEL
Anion gap: 9 (ref 5–15)
BUN: 12 mg/dL (ref 6–20)
CO2: 23 mmol/L (ref 22–32)
Calcium: 9 mg/dL (ref 8.9–10.3)
Chloride: 107 mmol/L (ref 98–111)
Creatinine, Ser: 1.02 mg/dL — ABNORMAL HIGH (ref 0.44–1.00)
GFR, Estimated: >60 mL/min (ref >60)
Glucose, Bld: 94 mg/dL (ref 70–99)
Potassium: 3.5 mmol/L (ref 3.5–5.1)
Sodium: 139 mmol/L (ref 135–145)

## 2023-04-17 LAB — TYPE AND SCREEN
ABO/RH(D): A POS
Antibody Screen: NEGATIVE

## 2023-04-17 LAB — CBC
HCT: 36.9 % (ref 36.0–46.0)
Hemoglobin: 11.5 g/dL — ABNORMAL LOW (ref 12.0–15.0)
MCH: 23 pg — ABNORMAL LOW (ref 26.0–34.0)
MCHC: 31.2 g/dL (ref 30.0–36.0)
MCV: 73.9 fL — ABNORMAL LOW (ref 80.0–100.0)
Platelets: 362 10*3/uL (ref 150–400)
RBC: 4.99 MIL/uL (ref 3.87–5.11)
RDW: 16.5 % — ABNORMAL HIGH (ref 11.5–15.5)
WBC: 6.6 10*3/uL (ref 4.0–10.5)
nRBC: 0 % (ref 0.0–0.2)

## 2023-04-17 LAB — CBG MONITORING, ED: Glucose-Capillary: 128 mg/dL — ABNORMAL HIGH (ref 70–99)

## 2023-04-17 NOTE — ED Notes (Signed)
No answer x3

## 2023-04-17 NOTE — ED Triage Notes (Signed)
Pt has been bleeding since may 2- obgyn placed [facility] May 21st. Pt states she still bleeding and cramping and has passed out twice at work. Pt reports clots, and uses 5-6 pads/day.

## 2023-04-18 ENCOUNTER — Encounter: Payer: Self-pay | Admitting: Emergency Medicine

## 2023-04-18 ENCOUNTER — Emergency Department
Admission: EM | Admit: 2023-04-18 | Discharge: 2023-04-18 | Disposition: A | Discharge status: Home / Self Care | Payer: BC Managed Care – PPO | Attending: Emergency Medicine | Admitting: Emergency Medicine

## 2023-04-18 ENCOUNTER — Emergency Department: Payer: BC Managed Care – PPO

## 2023-04-18 ENCOUNTER — Other Ambulatory Visit: Payer: Self-pay

## 2023-04-18 DIAGNOSIS — N939 Abnormal uterine and vaginal bleeding, unspecified: Secondary | ICD-10-CM | POA: Diagnosis not present

## 2023-04-18 LAB — URINALYSIS, ROUTINE W REFLEX MICROSCOPIC
Bacteria, UA: NONE SEEN
Bilirubin Urine: NEGATIVE
Glucose, UA: NEGATIVE mg/dL
Ketones, ur: NEGATIVE mg/dL
Leukocytes,Ua: NEGATIVE
Nitrite: NEGATIVE
Protein, ur: NEGATIVE mg/dL
RBC / HPF: >50 RBC/hpf (ref 0–5)
Specific Gravity, Urine: 1.011 (ref 1.005–1.030)
pH: 6 (ref 5.0–8.0)

## 2023-04-18 LAB — CBC WITH DIFFERENTIAL/PLATELET
Abs Immature Granulocytes: 0.01 10*3/uL (ref 0.00–0.07)
Basophils Absolute: 0.1 10*3/uL (ref 0.0–0.1)
Basophils Relative: 1 %
Eosinophils Absolute: 0.1 10*3/uL (ref 0.0–0.5)
Eosinophils Relative: 2 %
HCT: 36 % (ref 36.0–46.0)
Hemoglobin: 11.4 g/dL — ABNORMAL LOW (ref 12.0–15.0)
Immature Granulocytes: 0 %
Lymphocytes Relative: 38 %
Lymphs Abs: 1.9 10*3/uL (ref 0.7–4.0)
MCH: 23.2 pg — ABNORMAL LOW (ref 26.0–34.0)
MCHC: 31.7 g/dL (ref 30.0–36.0)
MCV: 73.2 fL — ABNORMAL LOW (ref 80.0–100.0)
Monocytes Absolute: 0.4 10*3/uL (ref 0.1–1.0)
Monocytes Relative: 8 %
Neutro Abs: 2.6 10*3/uL (ref 1.7–7.7)
Neutrophils Relative %: 51 %
Platelets: 349 10*3/uL (ref 150–400)
RBC: 4.92 MIL/uL (ref 3.87–5.11)
RDW: 16.6 % — ABNORMAL HIGH (ref 11.5–15.5)
WBC: 5.1 10*3/uL (ref 4.0–10.5)
nRBC: 0 % (ref 0.0–0.2)

## 2023-04-18 LAB — POC URINE PREG, ED: Preg Test, Ur: NEGATIVE

## 2023-04-18 MED ORDER — MEDROXYPROGESTERONE ACETATE 10 MG PO TABS: 10.0000 mg | ORAL_TABLET | Freq: Two times a day (BID) | ORAL | 0 refills | Status: DC

## 2023-04-18 NOTE — ED Provider Notes (Signed)
[facility] Provider Note    Event Date/Time   First MD Initiated Contact with Patient 04/18/23 [phone]     (approximate)   History   Vaginal Bleeding   HPI  Lori Savage is a 37 y.o. female G1 P1-0-0-1 presents today for evaluation of vaginal bleeding.  Patient has had a long history of dysfunctional uterine bleeding that has been unresponsive to hormone therapy, and underwent D&C in 2022.  Patient reports that she has had intermittent bleeding ever since May 2.  She reports that she had her IUD placed on May 21.  She was advised that she may have bleeding for the next week, but she reports that the week has now completed and she is still having bleeding.  She reports that she went through 6 pads yesterday and 1 pad today so far.       Physical Exam   Triage Vital Signs: ED Triage Vitals  Enc Vitals Group     BP 04/18/23 0739 127/77     Pulse Rate 04/18/23 0739 83     Resp 04/18/23 0739 18     Temp 04/18/23 0739 98.3 F (36.8 C)     Temp Source 04/18/23 0739 Oral     SpO2 04/18/23 0739 99 %     Weight 04/18/23 0736 180 lb (81.6 kg)     Height 04/18/23 0736 5\' 2"  (1.575 m)     Head Circumference --      Peak Flow --      Pain Score --      Pain Loc --      Pain Edu? --      Excl. in GC? --     Most recent vital signs: Vitals:   04/18/23 0739  BP: 127/77  Pulse: 83  Resp: 18  Temp: 98.3 F (36.8 C)  SpO2: 99%    Physical Exam Vitals and nursing note reviewed.  Constitutional:      General: Awake and alert. No acute distress.    Appearance: Normal appearance. The patient is normal weight.  HENT:     Head: Normocephalic and atraumatic.     Mouth: Mucous membranes are moist.  Eyes:     General: PERRL. Normal EOMs        Right eye: No discharge.        Left eye: No discharge.     Conjunctiva/sclera: Conjunctivae normal.  Cardiovascular:     Rate and Rhythm: Normal rate and regular rhythm.     Pulses: Normal pulses.  Pulmonary:      Effort: Pulmonary effort is normal. No respiratory distress.     Breath sounds: Normal breath sounds.  Abdominal:     Abdomen is soft. There is no abdominal tenderness. No rebound or guarding. No distention. Musculoskeletal:        General: No swelling. Normal range of motion.     Cervical back: Normal range of motion and neck supple.  Skin:    General: Skin is warm and dry.     Capillary Refill: Capillary refill takes less than 2 seconds.     Findings: No rash.  Neurological:     Mental Status: The patient is awake and alert.      ED Results / Procedures / Treatments   Labs (all labs ordered are listed, but only abnormal results are displayed) Labs Reviewed  CBC WITH DIFFERENTIAL/PLATELET - Abnormal; Notable for the following components:      Result Value  Hemoglobin 11.4 (*)    MCV 73.2 (*)    MCH 23.2 (*)    RDW 16.6 (*)    All other components within normal limits  URINALYSIS, ROUTINE W REFLEX MICROSCOPIC - Abnormal; Notable for the following components:   Color, Urine YELLOW (*)    APPearance HAZY (*)    Hgb urine dipstick LARGE (*)    All other components within normal limits  POC URINE PREG, ED     EKG     RADIOLOGY I independently reviewed and interpreted imaging and agree with radiologists findings.     PROCEDURES:  Critical Care performed:   Procedures   MEDICATIONS ORDERED IN ED: Medications - No data to display   IMPRESSION / MDM / ASSESSMENT AND PLAN / ED COURSE  I reviewed the triage vital signs and the nursing notes.   Differential diagnosis includes, but is not limited to, dysfunctional uterine bleeding, uterine tear, pregnancy, anemia.  I reviewed the patient's chart.  Patient has a history of dysfunctional uterine bleeding despite multiple OCP regimens.  She has also had a D&C in the past for her bleeding.  She had a Pap smear 12/02/2022 which was normal.  Patient had a Mirena IUD insertion on 04/07/2023.  Patient is awake and  alert, hemodynamically stable and afebrile.  Her abdomen is soft and nontender throughout.  Labs obtained are similar to yesterday, no significant drop in her H&H.  UA with microscopic blood as expected. Pregnancy is negative.  Ultrasound obtained for evaluation of IUD placement or other findings to explain her abnormal uterine bleeding, and shows that the IUD is low in the uterus.  I discussed her findings and presentation with Carie Caddy with the [facility], who recommends initiating Provera for 7 days and if her bleeding continues, then follow-up with Dr. Valentino Saxon regarding other management methods.  I discussed these recommendations with the patient who is in agreement.  She understands return precautions.  She was discharged in stable condition.   Patient's presentation is most consistent with acute complicated illness / injury requiring diagnostic workup.  Clinical Course as of 04/18/23 1113  Sat Apr 18, 2023  0981 Discussed with Carie Caddy recommends Provera twice daily for 7 days and if bleeding is still heavy, then follow-up with Dr. Valentino Saxon for discussion of hysterectomy. [JP]    Clinical Course User Index [JP] Vinia Jemmott, Herb Grays, PA-C     FINAL CLINICAL IMPRESSION(S) / ED DIAGNOSES   Final diagnoses:  Abnormal uterine bleeding (AUB)     Rx / DC Orders   ED Discharge Orders          Ordered    medroxyPROGESTERone (PROVERA) 10 MG tablet  2 times daily        04/18/23 0940             Note:  This document was prepared using Dragon voice recognition software and may include unintentional dictation errors.   Keturah Shavers 04/18/23 1113    Jene Every, MD 04/18/23 1113

## 2023-04-18 NOTE — ED Triage Notes (Signed)
Pt via POV from home. Pt was triaged and LWBS last night. Pt c/o vaginal bleeding since his Merena on 5/21 being placed. States that she has seen an OB and they prescribed her birth control but the bleeding would only stop temporarily.  EKG and lab work obtain last night.  Pt is A&OX4 and NAD

## 2023-04-18 NOTE — Discharge Instructions (Signed)
Please take the medication as prescribed for a week.  If your bleeding persists, then please discuss with Dr. Valentino Saxon further options for management.  Please return for any new, worsening, or change in symptoms or other concerns.

## 2023-05-05 ENCOUNTER — Ambulatory Visit: Payer: BC Managed Care – PPO | Admitting: Obstetrics and Gynecology

## 2023-05-06 DIAGNOSIS — N898 Other specified noninflammatory disorders of vagina: Secondary | ICD-10-CM | POA: Diagnosis not present

## 2023-05-06 DIAGNOSIS — Z01419 Encounter for gynecological examination (general) (routine) without abnormal findings: Secondary | ICD-10-CM | POA: Diagnosis not present

## 2023-05-06 DIAGNOSIS — Z3202 Encounter for pregnancy test, result negative: Secondary | ICD-10-CM | POA: Diagnosis not present

## 2023-05-06 DIAGNOSIS — Z6832 Body mass index (BMI) 32.0-32.9, adult: Secondary | ICD-10-CM | POA: Diagnosis not present

## 2023-05-11 ENCOUNTER — Other Ambulatory Visit: Payer: Self-pay | Admitting: Obstetrics and Gynecology

## 2023-05-11 DIAGNOSIS — N63 Unspecified lump in unspecified breast: Secondary | ICD-10-CM

## 2023-05-15 ENCOUNTER — Telehealth: Payer: Self-pay | Admitting: Family

## 2023-05-15 NOTE — Telephone Encounter (Signed)
Could not currently schedule due to patient's work schedule/timeframe. Patient stated they would call back when they were ready to schedule and move forward.

## 2023-06-12 ENCOUNTER — Ambulatory Visit
Admission: RE | Admit: 2023-06-12 | Discharge: 2023-06-12 | Disposition: A | Discharge status: Home / Self Care | Payer: BC Managed Care – PPO | Source: Ambulatory Visit | Attending: Obstetrics and Gynecology | Admitting: Obstetrics and Gynecology

## 2023-06-12 ENCOUNTER — Ambulatory Visit: Payer: BC Managed Care – PPO

## 2023-06-12 DIAGNOSIS — N6322 Unspecified lump in the left breast, upper inner quadrant: Secondary | ICD-10-CM | POA: Diagnosis not present

## 2023-06-12 DIAGNOSIS — N63 Unspecified lump in unspecified breast: Secondary | ICD-10-CM

## 2023-07-17 DIAGNOSIS — N898 Other specified noninflammatory disorders of vagina: Secondary | ICD-10-CM | POA: Diagnosis not present

## 2023-07-17 DIAGNOSIS — Z113 Encounter for screening for infections with a predominantly sexual mode of transmission: Secondary | ICD-10-CM | POA: Diagnosis not present

## 2023-07-17 DIAGNOSIS — R102 Pelvic and perineal pain: Secondary | ICD-10-CM | POA: Diagnosis not present

## 2023-09-14 DIAGNOSIS — E66811 Obesity, class 1: Secondary | ICD-10-CM | POA: Diagnosis not present

## 2023-09-14 DIAGNOSIS — L680 Hirsutism: Secondary | ICD-10-CM | POA: Diagnosis not present

## 2023-12-18 DIAGNOSIS — E282 Polycystic ovarian syndrome: Secondary | ICD-10-CM | POA: Diagnosis not present

## 2023-12-18 DIAGNOSIS — E559 Vitamin D deficiency, unspecified: Secondary | ICD-10-CM | POA: Diagnosis not present

## 2023-12-18 DIAGNOSIS — L659 Nonscarring hair loss, unspecified: Secondary | ICD-10-CM | POA: Diagnosis not present

## 2023-12-18 DIAGNOSIS — N760 Acute vaginitis: Secondary | ICD-10-CM | POA: Diagnosis not present

## 2024-04-20 ENCOUNTER — Encounter: Payer: Self-pay | Admitting: Student

## 2024-04-20 ENCOUNTER — Telehealth: Payer: Self-pay | Admitting: Student

## 2024-04-20 ENCOUNTER — Ambulatory Visit: Admitting: Student

## 2024-04-20 VITALS — BP 104/78 | HR 92 | Ht 62.0 in | Wt 190.0 lb

## 2024-04-20 DIAGNOSIS — D500 Iron deficiency anemia secondary to blood loss (chronic): Secondary | ICD-10-CM | POA: Diagnosis not present

## 2024-04-20 DIAGNOSIS — R7989 Other specified abnormal findings of blood chemistry: Secondary | ICD-10-CM | POA: Insufficient documentation

## 2024-04-20 DIAGNOSIS — E66811 Obesity, class 1: Secondary | ICD-10-CM

## 2024-04-20 DIAGNOSIS — E039 Hypothyroidism, unspecified: Secondary | ICD-10-CM | POA: Insufficient documentation

## 2024-04-20 DIAGNOSIS — E6609 Other obesity due to excess calories: Secondary | ICD-10-CM

## 2024-04-20 DIAGNOSIS — F419 Anxiety disorder, unspecified: Secondary | ICD-10-CM | POA: Insufficient documentation

## 2024-04-20 DIAGNOSIS — Z6834 Body mass index (BMI) 34.0-34.9, adult: Secondary | ICD-10-CM

## 2024-04-20 DIAGNOSIS — E282 Polycystic ovarian syndrome: Secondary | ICD-10-CM | POA: Insufficient documentation

## 2024-04-20 DIAGNOSIS — F4321 Adjustment disorder with depressed mood: Secondary | ICD-10-CM | POA: Insufficient documentation

## 2024-04-20 MED ORDER — PHENTERMINE-TOPIRAMATE ER 3.75-23 MG PO CP24: ORAL_CAPSULE | ORAL | 0 refills | Status: DC

## 2024-04-20 MED ORDER — TOPIRAMATE 25 MG PO TABS: ORAL_TABLET | ORAL | 0 refills | Status: DC

## 2024-04-20 MED ORDER — PHENTERMINE HCL 15 MG PO CAPS: 15.0000 mg | ORAL_CAPSULE | ORAL | 0 refills | Status: DC

## 2024-04-20 NOTE — Telephone Encounter (Signed)
 Patient came back into office stating that the Pharmacy called her and stated that her insurance will not cover a combination pill and could the Provider please send over two separate pills.

## 2024-04-20 NOTE — Assessment & Plan Note (Addendum)
 Has been trying work on diet and exercise but reports she has not had significant weight loss appears weight as been trending up from 175 in 2022 to 190 today. On chart review had normal thyroid  function in October and A1c in 11/2022 was normal. She would like to try oral weight loss medications over injection medications. We discuss the risks and benefits including increase in Bp and cardiovascular risk on phentermine and mood symptoms on topiramate as well as risk of regaining weight after discontinuing medication.  She would like to try this. Will also send to nutrition counseling as she has many questions regarding this. Follow up in 1 month

## 2024-04-20 NOTE — Progress Notes (Signed)
 New Patient Office Visit  Subjective    Patient ID: Lori Savage, female    DOB: 07/03/1986  Age: 38 y.o. MRN: [id number]  CC:  Chief Complaint  Patient presents with   Weight Check    Pt is concerned about weight.    Annual Exam    HPI Fallen Lori Savage is a 38 year old person living with PCOS presents to establish care  PCOS Takes spironolactone for PCOS for hirsutism. [facility] [place] [facility] this. Did have a visit with endocrinology a year ago as well but is not follow up with them. Is using mirena  which has helped with menorrhagia.   Mirena  was placed in 03/2024 for menorrhagia would like to continue will need replacement in 2032  Obesity She goes to the gym 3 times a week for inclined walk and stair master and walks 30 minutes a day but has not noticed weight loss. Has tried intermittent fasting but felt dizzy and jittery. Frequently feels hungry. Does not want Ozempic or Zepbound due to Aunt having complications with gastroparesis after being on GLP1.  Not eating cheese but has egg and yogurt. 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     Outpatient Encounter Medications as of 04/20/2024  Medication Sig   Cholecalciferol (VITAMIN D3) 250 MCG (10000 UT) capsule Take 10,000 Units by mouth daily.   levonorgestrel  (MIRENA , 52 MG,) 20 MCG/DAY IUD Take by intrauterine route.   phentermine 15 MG capsule Take 1 capsule (15 mg total) by mouth every morning.   spironolactone (ALDACTONE) 50 MG tablet Take 50 mg by mouth daily.   topiramate (TOPAMAX) 25 MG tablet Take 1 tablet (25 mg total) by mouth daily for 14 days, THEN 2 tablets (50 mg total) daily for 14 days.   [DISCONTINUED] Phentermine-Topiramate 3.75-23 MG CP24 Take 1 capsule by mouth daily for 14 days, THEN 2 capsules daily for 14 days.   [DISCONTINUED] Iron-Vitamins (GERITOL COMPLETE PO) Take by mouth. (Patient not taking: Reported on 04/20/2024)   [DISCONTINUED] medroxyPROGESTERone  (PROVERA )  10 MG tablet Take 1 tablet (10 mg total) by mouth in the morning and at bedtime for 7 days.   [DISCONTINUED] Probiotic Product (PROBIOTIC/PREBIOTIC/CRANBERRY) CAPS Take 1 capsule by mouth daily. (Patient not taking: Reported on 04/20/2024)   [DISCONTINUED] vitamin B-12 (CYANOCOBALAMIN) 100 MCG tablet Take 100 mcg by mouth daily. (Patient not taking: Reported on 04/20/2024)   No facility-administered encounter medications on file as of 04/20/2024.    Past Medical History:  Diagnosis Date   Allergy    Anemia    Anxiety    Clotting disorder (HCC)    Hypothyroidism    no meds   PCOS (polycystic ovarian syndrome)    Situational depression     Past Surgical History:  Procedure Laterality Date   CESAREAN SECTION     emergent section-was put to sleep for section   DILATION AND CURETTAGE OF UTERUS N/A 09/30/2021   Procedure: DILATATION AND CURETTAGE;  Surgeon: Teresa Fender, MD;  Location: [facility] ORS;  Service: Gynecology;  Laterality: N/A;    Family History  Problem Relation Age of Onset   Lupus Mother    Epilepsy Mother    Breast cancer Maternal Grandmother    Diabetes Maternal Grandmother    Breast cancer Paternal Grandmother    Leukemia Paternal Aunt    Prostate cancer Paternal Uncle    Ovarian cancer Neg Hx    Colon cancer Neg Hx     Social History  Socioeconomic History   Marital status: Single    Spouse name: Not on file   Number of children: 1   Years of education: Not on file   Highest education level: Associate degree: occupational, Scientist, product/process development, or vocational program  Occupational History   Not on file  Tobacco Use   Smoking status: Never   Smokeless tobacco: Never  Vaping Use   Vaping status: Never Used  Substance and Sexual Activity   Alcohol use: Not Currently    Comment: social   Drug use: No   Sexual activity: Yes    Birth control/protection: I.U.D., None  Other Topics Concern   Not on file  Social History Narrative   Not on file   Social Drivers of  Health   Financial Resource Strain: Medium Risk (04/16/2024)   Overall Financial Resource Strain (CARDIA)    Difficulty of Paying Living Expenses: Somewhat hard  Food Insecurity: Food Insecurity Present (04/16/2024)   Hunger Vital Sign    Worried About Running Out of Food in the Last Year: Often true    Ran Out of Food in the Last Year: Sometimes true  Transportation Needs: No Transportation Needs (04/16/2024)   PRAPARE - Administrator, Civil Service (Medical): No    Lack of Transportation (Non-Medical): No  Physical Activity: Insufficiently Active (04/16/2024)   Exercise Vital Sign    Days of Exercise per Week: 3 days    Minutes of Exercise per Session: 30 min  Stress: Stress Concern Present (04/16/2024)   [employer] of Occupational Health - Occupational Stress Questionnaire    Feeling of Stress : To some extent  Social Connections: Socially Isolated (04/16/2024)   Social Connection and Isolation Panel [NHANES]    Frequency of Communication with Friends and Family: More than three times a week    Frequency of Social Gatherings with Friends and Family: Once a week    Attends Religious Services: Never    Database administrator or Organizations: No    Attends Engineer, structural: Not on file    Marital Status: Never married  Intimate Partner Violence: Not At Risk (04/20/2024)   Humiliation, Afraid, Rape, and Kick questionnaire    Fear of Current or Ex-Partner: No    Emotionally Abused: No    Physically Abused: No    Sexually Abused: No    ROS Refer to HPI    Objective   BP 104/78   Pulse 92   Ht 5\' 2"  (1.575 m)   Wt 190 lb (86.2 kg)   SpO2 96%   BMI 34.75 kg/m   Physical Exam Constitutional:      Appearance: Normal appearance. She is obese.  HENT:     Mouth/Throat:     Mouth: Mucous membranes are moist.     Pharynx: Oropharynx is clear.  Cardiovascular:     Rate and Rhythm: Normal rate and regular rhythm.  Pulmonary:     Effort: Pulmonary  effort is normal.     Breath sounds: No rhonchi or rales.  Abdominal:     General: Abdomen is flat. Bowel sounds are normal. There is no distension.     Palpations: Abdomen is soft.     Tenderness: There is no abdominal tenderness.  Musculoskeletal:        General: Normal range of motion.     Right lower leg: No edema.     Left lower leg: No edema.  Skin:    General: Skin is warm and dry.  Capillary Refill: Capillary refill takes less than 2 seconds.  Neurological:     General: No focal deficit present.     Mental Status: She is alert and oriented to person, place, and time.  Psychiatric:        Mood and Affect: Mood normal.        Behavior: Behavior normal.        04/20/2024   10:01 AM 10/07/2021   11:39 AM 09/13/2021    9:55 AM  Depression screen PHQ 2/9  Decreased Interest 1 0 1  Down, Depressed, Hopeless 1  1  PHQ - 2 Score 2 0 2  Altered sleeping 1 0 0  Tired, decreased energy 3 0 3  Change in appetite 3 0 3  Feeling bad or failure about yourself  0 0 1  Trouble concentrating 0 0 0  Moving slowly or fidgety/restless 0 0 0  Suicidal thoughts 0 0 0  PHQ-9 Score 9 0 9  Difficult doing work/chores Somewhat difficult  Not difficult at all      04/20/2024   10:01 AM 10/07/2021   11:39 AM  GAD 7 : Generalized Anxiety Score  Nervous, Anxious, on Edge 0 0  Control/stop worrying 1 0  Worry too much - different things 3 0  Trouble relaxing 0 0  Restless 1 0  Easily annoyed or irritable 3 0  Afraid - awful might happen 0 0  Total GAD 7 Score 8 0  Anxiety Difficulty Somewhat difficult     Last CBC Lab Results  Component Value Date   WBC 5.1 04/18/2023   HGB 11.4 (L) 04/18/2023   HCT 36.0 04/18/2023   MCV 73.2 (L) 04/18/2023   MCH 23.2 (L) 04/18/2023   RDW 16.6 (H) 04/18/2023   PLT 349 04/18/2023   Last metabolic panel Lab Results  Component Value Date   GLUCOSE 94 04/17/2023   NA 139 04/17/2023   K 3.5 04/17/2023   CL 107 04/17/2023   CO2 23  04/17/2023   BUN 12 04/17/2023   CREATININE 1.02 (H) 04/17/2023   GFRNONAA >60 04/17/2023   CALCIUM 9.0 04/17/2023   PROT 6.5 12/02/2022   ALBUMIN 4.3 12/02/2022   LABGLOB 2.2 12/02/2022   AGRATIO 2.0 12/02/2022   BILITOT 0.3 12/02/2022   ALKPHOS 70 12/02/2022   AST 17 12/02/2022   ALT 16 12/02/2022   ANIONGAP 9 04/17/2023   Last lipids Lab Results  Component Value Date   CHOL 116 12/02/2022   HDL 55 12/02/2022   LDLCALC 47 12/02/2022   TRIG 65 12/02/2022   CHOLHDL 2.1 12/02/2022   Last hemoglobin A1c Lab Results  Component Value Date   HGBA1C 5.3 12/02/2022   Last thyroid  functions Lab Results  Component Value Date   TSH 0.830 12/02/2022   T4TOTAL 7.5 04/08/2019      Assessment & Plan:  Situational depression Assessment & Plan: Related to weight and home stressor. Reports mood symptoms are doing well and not bothersome lately. PHQ 9 is mildly elevated, will continue to monitor.    Iron deficiency anemia due to chronic blood loss Assessment & Plan: Due to menorrhagia, which has resolved after mirena  IUD was placed last year. Will repeat CBC today. Not taking oral iron due to constipation.   Orders: -     CBC with Differential/Platelet  Low vitamin D level Assessment & Plan: Currently taking vitamin D supplements, vitamin D level today.  Orders: -     VITAMIN D 25 Hydroxy (Vit-D Deficiency,  Fractures)  Class 1 obesity due to excess calories with serious comorbidity and body mass index (BMI) of 34.0 to 34.9 in adult Assessment & Plan: Has been trying work on diet and exercise but reports she has not had significant weight loss appears weight as been trending up from 175 in 2022 to 190 today. On chart review had normal thyroid  function in October and A1c in 11/2022 was normal. She would like to try oral weight loss medications over injection medications. We discuss the risks and benefits including increase in Bp and cardiovascular risk on phentermine and mood  symptoms on topiramate as well as risk of regaining weight after discontinuing medication.  She would like to try this. Will also send to nutrition counseling as she has many questions regarding this. Follow up in 1 month   Orders: -     Referral to Nutrition and Diabetes Services  Other orders -     Topiramate; Take 1 tablet (25 mg total) by mouth daily for 14 days, THEN 2 tablets (50 mg total) daily for 14 days.  Dispense: 42 tablet; Refill: 0 -     Phentermine HCl; Take 1 capsule (15 mg total) by mouth every morning.  Dispense: 30 capsule; Refill: 0    Return in about 4 weeks (around 05/18/2024) for physical, weight.   Barnetta Liberty, MD

## 2024-04-20 NOTE — Assessment & Plan Note (Signed)
 Related to weight and home stressor. Reports mood symptoms are doing well and not bothersome lately. PHQ 9 is mildly elevated, will continue to monitor.

## 2024-04-20 NOTE — Telephone Encounter (Signed)
 Please review.  KP

## 2024-04-20 NOTE — Assessment & Plan Note (Signed)
 Due to menorrhagia, which has resolved after mirena  IUD was placed last year. Will repeat CBC today. Not taking oral iron due to constipation.

## 2024-04-20 NOTE — Assessment & Plan Note (Signed)
 Currently taking vitamin D supplements, vitamin D level today.

## 2024-04-20 NOTE — Assessment & Plan Note (Deleted)
 Had mirena  placed in 2024 which has resolved bleeding. Will repeat CBC today as appears she has a history of anemia likely due to chronic blood loss

## 2024-04-21 ENCOUNTER — Ambulatory Visit: Payer: Self-pay | Admitting: Student

## 2024-04-21 LAB — CBC WITH DIFFERENTIAL/PLATELET
Basophils Absolute: 0.1 10*3/uL (ref 0.0–0.2)
Basos: 1 %
EOS (ABSOLUTE): 0.5 10*3/uL — ABNORMAL HIGH (ref 0.0–0.4)
Eos: 7 %
Hematocrit: 45.6 % (ref 34.0–46.6)
Hemoglobin: 14.6 g/dL (ref 11.1–15.9)
Immature Grans (Abs): 0 10*3/uL (ref 0.0–0.1)
Immature Granulocytes: 0 %
Lymphocytes Absolute: 3 10*3/uL (ref 0.7–3.1)
Lymphs: 38 %
MCH: 25.8 pg — ABNORMAL LOW (ref 26.6–33.0)
MCHC: 32 g/dL (ref 31.5–35.7)
MCV: 81 fL (ref 79–97)
Monocytes Absolute: 0.6 10*3/uL (ref 0.1–0.9)
Monocytes: 7 %
Neutrophils Absolute: 3.7 10*3/uL (ref 1.4–7.0)
Neutrophils: 47 %
Platelets: 378 10*3/uL (ref 150–450)
RBC: 5.66 x10E6/uL — ABNORMAL HIGH (ref 3.77–5.28)
RDW: 13.2 % (ref 11.7–15.4)
WBC: 7.9 10*3/uL (ref 3.4–10.8)

## 2024-04-21 LAB — VITAMIN D 25 HYDROXY (VIT D DEFICIENCY, FRACTURES): Vit D, 25-Hydroxy: 61.5 ng/mL (ref 30.0–100.0)

## 2024-05-27 ENCOUNTER — Encounter: Admitting: Student

## 2024-06-10 ENCOUNTER — Ambulatory Visit: Admitting: Dietician

## 2024-06-17 ENCOUNTER — Encounter: Admitting: Student

## 2024-06-24 DIAGNOSIS — Z01419 Encounter for gynecological examination (general) (routine) without abnormal findings: Secondary | ICD-10-CM | POA: Diagnosis not present

## 2024-07-19 DIAGNOSIS — Z3202 Encounter for pregnancy test, result negative: Secondary | ICD-10-CM | POA: Diagnosis not present

## 2024-07-19 DIAGNOSIS — Z113 Encounter for screening for infections with a predominantly sexual mode of transmission: Secondary | ICD-10-CM | POA: Diagnosis not present

## 2024-07-19 DIAGNOSIS — Z01419 Encounter for gynecological examination (general) (routine) without abnormal findings: Secondary | ICD-10-CM | POA: Diagnosis not present

## 2024-07-19 DIAGNOSIS — Z6833 Body mass index (BMI) 33.0-33.9, adult: Secondary | ICD-10-CM | POA: Diagnosis not present

## 2024-07-19 DIAGNOSIS — N760 Acute vaginitis: Secondary | ICD-10-CM | POA: Diagnosis not present

## 2024-10-07 DIAGNOSIS — H5210 Myopia, unspecified eye: Secondary | ICD-10-CM | POA: Diagnosis not present

## 2024-10-07 DIAGNOSIS — H0288A Meibomian gland dysfunction right eye, upper and lower eyelids: Secondary | ICD-10-CM | POA: Diagnosis not present

## 2024-10-07 DIAGNOSIS — H16229 Keratoconjunctivitis sicca, not specified as Sjogren's, unspecified eye: Secondary | ICD-10-CM | POA: Diagnosis not present

## 2024-10-07 DIAGNOSIS — H35411 Lattice degeneration of retina, right eye: Secondary | ICD-10-CM | POA: Diagnosis not present

## 2024-10-27 DIAGNOSIS — J019 Acute sinusitis, unspecified: Secondary | ICD-10-CM | POA: Diagnosis not present

## 2024-11-07 ENCOUNTER — Telehealth: Payer: Self-pay | Admitting: Student

## 2024-11-07 NOTE — Telephone Encounter (Signed)
 Copied from CRM #[id number]. Topic: Appointments - Appointment Info/Confirmation >> Nov 07, 2024  3:26 PM Winona R wrote: Pt was previoudly scehduled for a TOC with a co pay and the appointment has been fixed. She is now schedule for a physical with no copay unless she comes in for anything beyond a physical. Please keep the pts appointment scheduled. She has requested that CMA tzel M Camacho Ocampo not be a part of her care tomorrow. Please stress there is no payment for her appointment upon arrival unless there are additional concerns

## 2024-11-07 NOTE — Telephone Encounter (Signed)
 Please review

## 2024-11-07 NOTE — Telephone Encounter (Signed)
 Copied from CRM [id number]. Topic: General - Billing Inquiry >> Nov 07, 2024  2:45 PM Delon DASEN wrote: Reason for CRM: Patient was told there would be no copay for the physical, please call to verify- [phone]

## 2024-11-08 ENCOUNTER — Ambulatory Visit (INDEPENDENT_AMBULATORY_CARE_PROVIDER_SITE_OTHER): Admitting: Student

## 2024-11-08 ENCOUNTER — Encounter: Admitting: Student

## 2024-11-08 ENCOUNTER — Encounter: Payer: Self-pay | Admitting: Student

## 2024-11-08 VITALS — BP 98/66 | HR 91 | Temp 97.7°F | Ht 62.0 in | Wt 192.0 lb

## 2024-11-08 DIAGNOSIS — Z0000 Encounter for general adult medical examination without abnormal findings: Secondary | ICD-10-CM | POA: Diagnosis not present

## 2024-11-08 NOTE — Assessment & Plan Note (Addendum)
 Labs ordered today Declined flu vaccine Has reduced salt and bread intake, would like to go to gym does not feel like she has the time, encouraged healthy diet and regular exercise Last pap 12/02/2022 NLM, HPV negative

## 2024-11-08 NOTE — Progress Notes (Signed)
 "  Complete physical exam  Patient: Lori Savage   DOB: 05-12-1986   38 y.o. Female  MRN: [id number]  Subjective:    Chief Complaint  Patient presents with   Annual Exam    Lori Savage is a 38 y.o. female who presents today for a complete physical exam. She reports consuming a general diet. The patient does not participate in regular exercise at present. She generally feels well. She reports sleeping fairly well. She does not have additional problems to discuss today.     Patient Active Problem List   Diagnosis Date Noted   Annual physical exam 11/08/2024   Iron deficiency anemia due to chronic blood loss 04/20/2024   Low vitamin D  level 04/20/2024   Situational depression    PCOS (polycystic ovarian syndrome)    Anxiety    Menorrhagia with regular cycle 12/02/2022   History of PCOS 12/02/2022   Class 1 obesity due to excess calories with serious comorbidity and body mass index (BMI) of 34.0 to 34.9 in adult 12/02/2022      Patient Care Team: Lemon Raisin, MD as PCP - General (Internal Medicine)   Show/hide medication list[1]  ROS Refer to HPI     Objective:    BP 98/66   Pulse 91   Temp 97.7 F (36.5 C) (Oral)   Ht 5' 2 (1.575 m)   Wt 192 lb (87.1 kg)   SpO2 97%   BMI 35.12 kg/m  BP Readings from Last 3 Encounters:  11/08/24 98/66  04/20/24 104/78  04/18/23 127/77    Physical Exam Constitutional:      Appearance: Normal appearance.  HENT:     Head: Normocephalic and atraumatic.     Right Ear: Tympanic membrane normal.     Left Ear: Tympanic membrane normal.     Mouth/Throat:     Mouth: Mucous membranes are moist.     Pharynx: Oropharynx is clear.  Eyes:     Extraocular Movements: Extraocular movements intact.     Pupils: Pupils are equal, round, and reactive to light.  Cardiovascular:     Rate and Rhythm: Normal rate and regular rhythm.     Heart sounds: No murmur heard. Pulmonary:     Effort: Pulmonary effort is normal.     Breath  sounds: No rhonchi or rales.  Abdominal:     General: Abdomen is flat. Bowel sounds are normal. There is no distension.     Palpations: Abdomen is soft.     Tenderness: There is no abdominal tenderness.  Musculoskeletal:        General: Normal range of motion.     Cervical back: Normal range of motion. No rigidity.     Right lower leg: No edema.     Left lower leg: No edema.  Skin:    General: Skin is warm and dry.     Capillary Refill: Capillary refill takes less than 2 seconds.  Neurological:     General: No focal deficit present.     Mental Status: She is alert and oriented to person, place, and time.  Psychiatric:        Mood and Affect: Mood normal.        Behavior: Behavior normal.         11/08/2024    2:03 PM 04/20/2024   10:01 AM 10/07/2021   11:39 AM  Depression screen PHQ 2/9  Decreased Interest 1 1 0  Down, Depressed, Hopeless 1 1   PHQ -  2 Score 2 2 0  Altered sleeping 1 1 0  Tired, decreased energy 0 3 0  Change in appetite 3 3 0  Feeling bad or failure about yourself  1 0 0  Trouble concentrating 0 0 0  Moving slowly or fidgety/restless 0 0 0  Suicidal thoughts 0 0 0  PHQ-9 Score 7 9  0   Difficult doing work/chores Not difficult at all Somewhat difficult      Data saved with a previous flowsheet row definition      11/08/2024    2:05 PM 04/20/2024   10:01 AM 10/07/2021   11:39 AM  GAD 7 : Generalized Anxiety Score  Nervous, Anxious, on Edge 0 0 0  Control/stop worrying 2 1 0  Worry too much - different things 2 3 0  Trouble relaxing 2 0 0  Restless 0 1 0  Easily annoyed or irritable 3 3 0  Afraid - awful might happen 0 0 0  Total GAD 7 Score 9 8 0  Anxiety Difficulty Not difficult at all Somewhat difficult     No results found for any visits on 11/08/24. Last CBC Lab Results  Component Value Date   WBC 7.9 04/20/2024   HGB 14.6 04/20/2024   HCT 45.6 04/20/2024   MCV 81 04/20/2024   MCH 25.8 (L) 04/20/2024   RDW 13.2 04/20/2024   PLT  378 04/20/2024   Last metabolic panel Lab Results  Component Value Date   GLUCOSE 94 04/17/2023   NA 139 04/17/2023   K 3.5 04/17/2023   CL 107 04/17/2023   CO2 23 04/17/2023   BUN 12 04/17/2023   CREATININE 1.02 (H) 04/17/2023   GFRNONAA >60 04/17/2023   CALCIUM 9.0 04/17/2023   PROT 6.5 12/02/2022   ALBUMIN 4.3 12/02/2022   LABGLOB 2.2 12/02/2022   AGRATIO 2.0 12/02/2022   BILITOT 0.3 12/02/2022   ALKPHOS 70 12/02/2022   AST 17 12/02/2022   ALT 16 12/02/2022   ANIONGAP 9 04/17/2023   Last lipids Lab Results  Component Value Date   CHOL 116 12/02/2022   HDL 55 12/02/2022   LDLCALC 47 12/02/2022   TRIG 65 12/02/2022   CHOLHDL 2.1 12/02/2022   Last thyroid  functions Lab Results  Component Value Date   TSH 0.830 12/02/2022   T4TOTAL 7.5 04/08/2019        Assessment & Plan:    Routine Health Maintenance and Physical Exam  Health Maintenance  Topic Date Due   COVID-19 Vaccine (1) 11/24/2024*   Flu Shot  02/14/2025*   Hepatitis B Vaccine (1 of 3 - 19+ 3-dose series) 11/08/2025*   HPV Vaccine (1 - Risk 3-dose SCDM series) 11/08/2025*   Pap with HPV screening  12/03/2027   DTaP/Tdap/Td vaccine (2 - Td or Tdap) 12/02/2032   Hepatitis C Screening  Completed   HIV Screening  Completed   Pneumococcal Vaccine  Aged Out   Meningitis B Vaccine  Aged Out  *Topic was postponed. The date shown is not the original due date.    Discussed health benefits of physical activity, and encouraged her to engage in regular exercise appropriate for her age and condition.  Annual physical exam Assessment & Plan: Labs ordered today Declined flu vaccine Has reduced salt and bread intake, would like to go to gym does not feel like she has the time, encouraged healthy diet and regular exercise Last pap 12/02/2022 NLM, HPV negative     Orders: -     CBC with  Differential/Platelet -     TSH -     Lipid panel -     Comprehensive metabolic panel with GFR    Return in about 3  months (around 02/06/2025) for obesity .     Harlene Saddler, MD     [1]  Outpatient Medications Prior to Visit  Medication Sig   levonorgestrel  (MIRENA , 52 MG,) 20 MCG/DAY IUD Take by intrauterine route.   [DISCONTINUED] Cholecalciferol (VITAMIN D3) 250 MCG (10000 UT) capsule Take 10,000 Units by mouth daily.   [DISCONTINUED] phentermine  15 MG capsule Take 1 capsule (15 mg total) by mouth every morning.   [DISCONTINUED] spironolactone (ALDACTONE) 50 MG tablet Take 50 mg by mouth daily.   [DISCONTINUED] topiramate  (TOPAMAX ) 25 MG tablet Take 1 tablet (25 mg total) by mouth daily for 14 days, THEN 2 tablets (50 mg total) daily for 14 days.   No facility-administered medications prior to visit.   "

## 2024-11-09 ENCOUNTER — Ambulatory Visit: Payer: Self-pay | Admitting: Student

## 2024-11-09 LAB — COMPREHENSIVE METABOLIC PANEL WITH GFR
ALT: 17 IU/L (ref 0–32)
AST: 17 IU/L (ref 0–40)
Albumin: 4.2 g/dL (ref 3.9–4.9)
Alkaline Phosphatase: 70 IU/L (ref 41–116)
BUN/Creatinine Ratio: 10 (ref 9–23)
BUN: 10 mg/dL (ref 6–20)
Bilirubin Total: 0.6 mg/dL (ref 0.0–1.2)
CO2: 25 mmol/L (ref 20–29)
Calcium: 10.4 mg/dL — ABNORMAL HIGH (ref 8.7–10.2)
Chloride: 105 mmol/L (ref 96–106)
Creatinine, Ser: 1 mg/dL (ref 0.57–1.00)
Globulin, Total: 2.8 g/dL (ref 1.5–4.5)
Glucose: 85 mg/dL (ref 70–99)
Potassium: 4.2 mmol/L (ref 3.5–5.2)
Sodium: 142 mmol/L (ref 134–144)
Total Protein: 7 g/dL (ref 6.0–8.5)
eGFR: 75 mL/min/1.73

## 2024-11-09 LAB — CBC WITH DIFFERENTIAL/PLATELET
Basophils Absolute: 0.1 x10E3/uL (ref 0.0–0.2)
Basos: 1 %
EOS (ABSOLUTE): 0.1 x10E3/uL (ref 0.0–0.4)
Eos: 1 %
Hematocrit: 45.3 % (ref 34.0–46.6)
Hemoglobin: 14.2 g/dL (ref 11.1–15.9)
Immature Grans (Abs): 0 x10E3/uL (ref 0.0–0.1)
Immature Granulocytes: 0 %
Lymphocytes Absolute: 2.4 x10E3/uL (ref 0.7–3.1)
Lymphs: 27 %
MCH: 25.6 pg — ABNORMAL LOW (ref 26.6–33.0)
MCHC: 31.3 g/dL — ABNORMAL LOW (ref 31.5–35.7)
MCV: 82 fL (ref 79–97)
Monocytes Absolute: 0.6 x10E3/uL (ref 0.1–0.9)
Monocytes: 6 %
Neutrophils Absolute: 5.8 x10E3/uL (ref 1.4–7.0)
Neutrophils: 65 %
Platelets: 438 x10E3/uL (ref 150–450)
RBC: 5.54 x10E6/uL — ABNORMAL HIGH (ref 3.77–5.28)
RDW: 12.2 % (ref 11.7–15.4)
WBC: 9 x10E3/uL (ref 3.4–10.8)

## 2024-11-09 LAB — LIPID PANEL
Chol/HDL Ratio: 2.2 ratio (ref 0.0–4.4)
Cholesterol, Total: 107 mg/dL (ref 100–199)
HDL: 48 mg/dL
LDL Chol Calc (NIH): 38 mg/dL (ref 0–99)
Triglycerides: 117 mg/dL (ref 0–149)
VLDL Cholesterol Cal: 21 mg/dL (ref 5–40)

## 2024-11-09 LAB — TSH: TSH: 0.561 u[IU]/mL (ref 0.450–4.500)

## 2024-11-15 DIAGNOSIS — N3000 Acute cystitis without hematuria: Secondary | ICD-10-CM | POA: Diagnosis not present

## 2024-12-11 ENCOUNTER — Emergency Department
Admission: EM | Admit: 2024-12-11 | Discharge: 2024-12-11 | Disposition: A | Discharge status: Home / Self Care | Attending: Emergency Medicine | Admitting: Emergency Medicine

## 2024-12-11 DIAGNOSIS — N390 Urinary tract infection, site not specified: Secondary | ICD-10-CM | POA: Diagnosis not present

## 2024-12-11 DIAGNOSIS — R109 Unspecified abdominal pain: Secondary | ICD-10-CM | POA: Diagnosis present

## 2024-12-11 DIAGNOSIS — B9689 Other specified bacterial agents as the cause of diseases classified elsewhere: Secondary | ICD-10-CM | POA: Diagnosis not present

## 2024-12-11 LAB — COMPREHENSIVE METABOLIC PANEL WITH GFR
ALT: 17 U/L (ref 0–44)
AST: 20 U/L (ref 15–41)
Albumin: 4.1 g/dL (ref 3.5–5.0)
Alkaline Phosphatase: 60 U/L (ref 38–126)
Anion gap: 12 (ref 5–15)
BUN: 9 mg/dL (ref 6–20)
CO2: 23 mmol/L (ref 22–32)
Calcium: 9.7 mg/dL (ref 8.9–10.3)
Chloride: 109 mmol/L (ref 98–111)
Creatinine, Ser: 1.03 mg/dL — ABNORMAL HIGH (ref 0.44–1.00)
GFR, Estimated: >60 mL/min
Glucose, Bld: 96 mg/dL (ref 70–99)
Potassium: 4.1 mmol/L (ref 3.5–5.1)
Sodium: 143 mmol/L (ref 135–145)
Total Bilirubin: 0.9 mg/dL (ref 0.0–1.2)
Total Protein: 7.1 g/dL (ref 6.5–8.1)

## 2024-12-11 LAB — CBC
HCT: 39.6 % (ref 36.0–46.0)
Hemoglobin: 13.6 g/dL (ref 12.0–15.0)
MCH: 26.1 pg (ref 26.0–34.0)
MCHC: 34.3 g/dL (ref 30.0–36.0)
MCV: 76 fL — ABNORMAL LOW (ref 80.0–100.0)
Platelets: 382 10*3/uL (ref 150–400)
RBC: 5.21 MIL/uL — ABNORMAL HIGH (ref 3.87–5.11)
RDW: 13.4 % (ref 11.5–15.5)
WBC: 10.9 10*3/uL — ABNORMAL HIGH (ref 4.0–10.5)
nRBC: 0 % (ref 0.0–0.2)

## 2024-12-11 LAB — URINALYSIS, ROUTINE W REFLEX MICROSCOPIC
Bilirubin Urine: NEGATIVE
Glucose, UA: NEGATIVE mg/dL
Ketones, ur: 5 mg/dL — AB
Nitrite: POSITIVE — AB
Protein, ur: >=300 mg/dL — AB
RBC / HPF: >50 RBC/hpf (ref 0–5)
Specific Gravity, Urine: 1.011 (ref 1.005–1.030)
Squamous Epithelial / HPF: 0 /HPF (ref 0–5)
WBC, UA: >50 WBC/hpf (ref 0–5)
pH: 7 (ref 5.0–8.0)

## 2024-12-11 LAB — POC URINE PREG, ED: Preg Test, Ur: NEGATIVE

## 2024-12-11 LAB — LIPASE, BLOOD: Lipase: 21 U/L (ref 11–51)

## 2024-12-11 MED ORDER — FLUCONAZOLE 150 MG PO TABS: 150.0000 mg | ORAL_TABLET | Freq: Every day | ORAL | 1 refills | Status: AC

## 2024-12-11 MED ORDER — PHENAZOPYRIDINE HCL 100 MG PO TABS: 100.0000 mg | ORAL_TABLET | Freq: Three times a day (TID) | ORAL | 0 refills | Status: AC | PRN

## 2024-12-11 MED ORDER — SODIUM CHLORIDE 0.9 % IV SOLN
1.0000 g | Freq: Once | INTRAVENOUS | Status: AC
  Administered 2024-12-11: 1 g via INTRAVENOUS
  Filled 2024-12-11: qty 10

## 2024-12-11 MED ORDER — NITROFURANTOIN MONOHYD MACRO 100 MG PO CAPS: 100.0000 mg | ORAL_CAPSULE | Freq: Two times a day (BID) | ORAL | 0 refills | Status: AC

## 2024-12-11 MED ORDER — PHENAZOPYRIDINE HCL 100 MG PO TABS
95.0000 mg | ORAL_TABLET | Freq: Once | ORAL | Status: AC
  Administered 2024-12-11: 100 mg via ORAL
  Filled 2024-12-11: qty 1

## 2024-12-11 MED ORDER — SODIUM CHLORIDE 0.9 % IV BOLUS
1000.0000 mL | Freq: Once | INTRAVENOUS | Status: AC
  Administered 2024-12-11: 1000 mL via INTRAVENOUS

## 2024-12-11 NOTE — ED Provider Notes (Signed)
 "  [facility] Provider Note    Event Date/Time   First MD Initiated Contact with Patient 12/11/24 1511     (approximate)   History   Abdominal Pain and Hematuria   HPI  Manda ARIENNE GARTIN is a 39 y.o. female who presents to the emergency department today because of abdominal pain and hematuria.  The symptoms started yesterday.  The pain is located in the suprapubic region.  It has gotten worse today.  The pain is made worse when the patient urinates.  The patient states that she has had urinary tract infections in the past.  She did try taking some Azo without any significant relief.  She denies any fevers.  No nausea or vomiting.     Physical Exam   Triage Vital Signs: ED Triage Vitals [12/11/24 1429]  Encounter Vitals Group     BP 128/82     Girls Systolic BP Percentile      Girls Diastolic BP Percentile      Boys Systolic BP Percentile      Boys Diastolic BP Percentile      Pulse Rate 85     Resp 20     Temp 97.8 F (36.6 C)     Temp Source Oral     SpO2 100 %     Weight 192 lb (87.1 kg)     Height 5' 2 (1.575 m)     Head Circumference      Peak Flow      Pain Score 10     Pain Loc      Pain Education      Exclude from Growth Chart     Most recent vital signs: Vitals:   12/11/24 1429 12/11/24 1503  BP: 128/82   Pulse: 85   Resp: 20   Temp: 97.8 F (36.6 C)   SpO2: 100% 100%   General: Awake, alert, oriented. CV:  Good peripheral perfusion. Regular rate and rhythm. Resp:  Normal effort. Lungs clear. Abd:  No distention. Minimal tenderness in the suprapubic region.  ED Results / Procedures / Treatments   Labs (all labs ordered are listed, but only abnormal results are displayed) Labs Reviewed  COMPREHENSIVE METABOLIC PANEL WITH GFR - Abnormal; Notable for the following components:      Result Value   Creatinine, Ser 1.03 (*)    All other components within normal limits  CBC - Abnormal; Notable for the following components:    WBC 10.9 (*)    RBC 5.21 (*)    MCV 76.0 (*)    All other components within normal limits  URINALYSIS, ROUTINE W REFLEX MICROSCOPIC - Abnormal; Notable for the following components:   Color, Urine AMBER (*)    APPearance CLOUDY (*)    Hgb urine dipstick LARGE (*)    Ketones, ur 5 (*)    Protein, ur >=300 (*)    Nitrite POSITIVE (*)    Leukocytes,Ua SMALL (*)    Bacteria, UA RARE (*)    Non Squamous Epithelial PRESENT (*)    All other components within normal limits  LIPASE, BLOOD  POC URINE PREG, ED     EKG  None   RADIOLOGY None   PROCEDURES:  Critical Care performed: No    MEDICATIONS ORDERED IN ED: Medications  sodium chloride  0.9 % bolus 1,000 mL (0 mLs Intravenous Stopped 12/11/24 1803)  cefTRIAXone  (ROCEPHIN ) 1 g in sodium chloride  0.9 % 100 mL IVPB (0 g Intravenous Stopped 12/11/24 1707)  phenazopyridine  (PYRIDIUM ) tablet 100 mg (100 mg Oral Given 12/11/24 1628)     IMPRESSION / MDM / ASSESSMENT AND PLAN / ED COURSE  I reviewed the triage vital signs and the nursing notes.                              Differential diagnosis includes, but is not limited to, UTI, stone  Patient's presentation is most consistent with acute presentation with potential threat to life or bodily function.  Patient presented to the emergency department today because of concerns for suprapubic region and possible urinary tract infection.  On exam patient does have slight suprapubic tenderness.  The patient urine is consistent with infection.  No significant leukocytosis in the blood work.  Patient is afebrile.  At this time I do think it is reasonable for patient be discharged home.  Will give prescription for antibiotics and pain medication.   FINAL CLINICAL IMPRESSION(S) / ED DIAGNOSES   Final diagnoses:  Lower urinary tract infectious disease       Note:  This document was prepared using Dragon voice recognition software and may include unintentional dictation errors.     Floy Roberts, MD 12/11/24 1918  "

## 2024-12-11 NOTE — ED Triage Notes (Signed)
 Pt c/o lower abdominal pain x1 days and hematuria/dysuria starting today.  Pain score 10/10.  Pt reports taking AZO w/o relief.

## 2025-02-10 ENCOUNTER — Ambulatory Visit: Admitting: Student
